# Patient Record
Sex: Female | Born: 1975 | Race: White | Hispanic: Yes | Marital: Married | Smoking: Never smoker
Health system: Southern US, Community
[De-identification: ages and names within clinical notes are randomized; demographics above are authoritative.]

## PROBLEM LIST (undated history)

## (undated) DIAGNOSIS — A749 Chlamydial infection, unspecified: Secondary | ICD-10-CM

## (undated) DIAGNOSIS — E669 Obesity, unspecified: Secondary | ICD-10-CM

## (undated) DIAGNOSIS — E039 Hypothyroidism, unspecified: Secondary | ICD-10-CM

## (undated) DIAGNOSIS — K760 Fatty (change of) liver, not elsewhere classified: Secondary | ICD-10-CM

## (undated) DIAGNOSIS — A599 Trichomoniasis, unspecified: Secondary | ICD-10-CM

## (undated) HISTORY — DX: Hypothyroidism, unspecified: E03.9

## (undated) HISTORY — DX: Obesity, unspecified: E66.9

## (undated) HISTORY — DX: Trichomoniasis, unspecified: A59.9

## (undated) HISTORY — DX: Fatty (change of) liver, not elsewhere classified: K76.0

## (undated) HISTORY — PX: INTRAUTERINE DEVICE (IUD) INSERTION: SHX5877

## (undated) HISTORY — DX: Chlamydial infection, unspecified: A74.9

---

## 1997-05-04 ENCOUNTER — Other Ambulatory Visit: Admission: RE | Admit: 1997-05-04 | Discharge: 1997-05-04 | Payer: Self-pay | Admitting: Gynecology

## 1998-05-05 ENCOUNTER — Other Ambulatory Visit: Admission: RE | Admit: 1998-05-05 | Discharge: 1998-05-05 | Payer: Self-pay | Admitting: Gynecology

## 1998-09-01 ENCOUNTER — Encounter: Payer: Self-pay | Admitting: Gynecology

## 1998-09-01 ENCOUNTER — Ambulatory Visit (HOSPITAL_COMMUNITY): Admission: RE | Admit: 1998-09-01 | Discharge: 1998-09-01 | Payer: Self-pay | Admitting: Gynecology

## 1999-02-14 ENCOUNTER — Ambulatory Visit (HOSPITAL_COMMUNITY): Admission: RE | Admit: 1999-02-14 | Discharge: 1999-02-14 | Payer: Self-pay | Admitting: *Deleted

## 1999-03-28 ENCOUNTER — Ambulatory Visit (HOSPITAL_COMMUNITY): Admission: RE | Admit: 1999-03-28 | Discharge: 1999-03-28 | Payer: Self-pay | Admitting: *Deleted

## 1999-05-10 ENCOUNTER — Inpatient Hospital Stay (HOSPITAL_COMMUNITY): Admission: AD | Admit: 1999-05-10 | Discharge: 1999-05-10 | Payer: Self-pay | Admitting: *Deleted

## 1999-05-16 ENCOUNTER — Encounter (INDEPENDENT_AMBULATORY_CARE_PROVIDER_SITE_OTHER): Payer: Self-pay | Admitting: Specialist

## 1999-05-16 ENCOUNTER — Inpatient Hospital Stay (HOSPITAL_COMMUNITY): Admission: AD | Admit: 1999-05-16 | Discharge: 1999-05-20 | Payer: Self-pay | Admitting: Obstetrics & Gynecology

## 2004-11-07 ENCOUNTER — Other Ambulatory Visit: Admission: RE | Admit: 2004-11-07 | Discharge: 2004-11-07 | Payer: Self-pay | Admitting: Family Medicine

## 2008-11-03 ENCOUNTER — Ambulatory Visit: Payer: Self-pay | Admitting: Gynecology

## 2008-11-03 ENCOUNTER — Encounter: Payer: Self-pay | Admitting: Gynecology

## 2008-11-03 ENCOUNTER — Other Ambulatory Visit: Admission: RE | Admit: 2008-11-03 | Discharge: 2008-11-03 | Payer: Self-pay | Admitting: Gynecology

## 2009-05-05 ENCOUNTER — Ambulatory Visit: Payer: Self-pay | Admitting: Gynecology

## 2009-05-05 DIAGNOSIS — A749 Chlamydial infection, unspecified: Secondary | ICD-10-CM

## 2009-05-05 HISTORY — DX: Chlamydial infection, unspecified: A74.9

## 2009-06-03 ENCOUNTER — Ambulatory Visit: Payer: Self-pay | Admitting: Gynecology

## 2009-06-17 ENCOUNTER — Ambulatory Visit: Payer: Self-pay | Admitting: Gynecology

## 2009-12-24 ENCOUNTER — Inpatient Hospital Stay (HOSPITAL_COMMUNITY)
Admission: AD | Admit: 2009-12-24 | Discharge: 2009-12-24 | Payer: Self-pay | Source: Home / Self Care | Attending: Obstetrics & Gynecology | Admitting: Obstetrics & Gynecology

## 2009-12-30 ENCOUNTER — Inpatient Hospital Stay (HOSPITAL_COMMUNITY)
Admission: AD | Admit: 2009-12-30 | Discharge: 2010-01-02 | Payer: Self-pay | Source: Home / Self Care | Attending: Obstetrics and Gynecology | Admitting: Obstetrics and Gynecology

## 2010-03-28 LAB — CBC
HCT: 28.4 % — ABNORMAL LOW (ref 36.0–46.0)
HCT: 35.9 % — ABNORMAL LOW (ref 36.0–46.0)
Hemoglobin: 12.7 g/dL (ref 12.0–15.0)
Hemoglobin: 9.7 g/dL — ABNORMAL LOW (ref 12.0–15.0)
MCH: 30.6 pg (ref 26.0–34.0)
MCH: 31.2 pg (ref 26.0–34.0)
MCHC: 34.2 g/dL (ref 30.0–36.0)
MCHC: 35.4 g/dL (ref 30.0–36.0)
MCV: 88.2 fL (ref 78.0–100.0)
MCV: 89.6 fL (ref 78.0–100.0)
Platelets: 145 10*3/uL — ABNORMAL LOW (ref 150–400)
Platelets: 169 10*3/uL (ref 150–400)
RBC: 3.17 MIL/uL — ABNORMAL LOW (ref 3.87–5.11)
RBC: 4.07 MIL/uL (ref 3.87–5.11)
RDW: 13.2 % (ref 11.5–15.5)
RDW: 13.4 % (ref 11.5–15.5)
WBC: 6.7 10*3/uL (ref 4.0–10.5)
WBC: 8.8 10*3/uL (ref 4.0–10.5)

## 2010-03-28 LAB — RPR: RPR Ser Ql: NONREACTIVE

## 2010-06-03 NOTE — Discharge Summary (Signed)
[facility]  Patient:    Sheryl Logan, Sheryl Logan                       MRN: [id number] Adm. Date:  [id number] Disc. Date: [id number] Attending:  Antionette Char Dictator:   Marvis Moeller, C.N.M.                           Discharge Summary  HISTORY OF PRESENT ILLNESS:   This is a 35 year old Hispanic female, gravida 4, para 2-0-1-2, presenting at 40 weeks who had bloody show, contractions, and possible rupture of membranes.  OBSTETRICAL HISTORY:          Two term NSVDs, AGA babies, and SAB at eight weeks with D&C.  SOCIAL HISTORY:               Negative for smoking, alcohol, or substance abuse.  GYNECOLOGICAL HISTORY:        Noncontributory.  PAST MEDICAL HISTORY:         Noncontributory.  PAST SURGICAL HISTORY:        No surgeries.  MEDICATIONS:                  Prenatal vitamins.  ALLERGIES:                    None.  PRENATAL LABORATORY DATA:     Rubella nonimmune.  GBS positive February 21, 1999.  PHYSICAL EXAMINATION:  VITAL SIGNS:                  Temperature 99.1, BP 147/86, pulse 50.  Fetal heart rate was in the 130s and reactive without decelerations.  Contractions were occurring every three to four minutes.  CHEST:                        Clear bilaterally.  CARDIOVASCULAR:               Regular rate and rhythm.  ABDOMEN:                      Term-size, gravid.  EXTREMITIES:                  DTRs were 2+ without clonus.  She had 2+ pedal edema.  CERVICAL:                     On admission she was 3-4, 70%, -2, cephalic.  On speculum exam, there was no pooling and negative ferning.  LABORATORY DATA:              Preeclampsia laboratories were significant for a uric acid of 8.8 and a creatinine of 0.8.  Of note, her ultrasound done at 34 weeks showed good interval growth and normal amniotic fluid volume.  HOSPITAL COURSE:              The patient was admitted, and Pitocin augmentation was successful.  She progressed in labor well  and received magnesium sulfate in labor for seizure prophylaxis.  After approximately 1-1/2 hours second stage, she had a spontaneous vaginal delivery of a viable female whose Apgars were 1 at one minute, 3 at five minutes, and 6 at 10 minutes. The infant was initially bagged by R.N., and NICU team was present at approximately three minutes after the birth.  The cord pH was 7.33.  Third stage was  uneventful.  The second-degree midline perineal laceration was repaired with 3-0 Vicryl, and the mother received postpartum Pitocin IV.  She continued on magnesium sulfate for two days, as her diuresis was relatively slow to occur.  She did get severely anemia, with a hemoglobin in the range of 6.3, down from her baseline on admission which was 12.2.  It was not noted in her postpartum progress notes that she had any excessive bleeding or any atony, and the effect was felt to be dilutional to some extent.  On postpartum day #3, magnesium was discontinued, and the patient remained asymptomatic for preeclampsia as well as asymptomatic for anemia.  She did not have tachycardia, shortness of breath, or orthostatic symptoms.  On the day of discharge, blood pressure is 120/70, pulse 80, and temperature is 98.  After receiving rubella vaccination, she was sent home on iron supplementation and prenatal vitamins as well as ibuprofen and Micronor.  She had routine postpartum instructions and was to be seen at six weeks at [facility]. DD:  08/15/99 TD:  08/16/99 Job: [id number]

## 2012-10-15 ENCOUNTER — Encounter: Payer: Self-pay | Admitting: Gynecology

## 2012-10-15 ENCOUNTER — Ambulatory Visit (INDEPENDENT_AMBULATORY_CARE_PROVIDER_SITE_OTHER): Payer: Commercial Managed Care - PPO | Admitting: Gynecology

## 2012-10-15 ENCOUNTER — Other Ambulatory Visit (HOSPITAL_COMMUNITY)
Admission: RE | Admit: 2012-10-15 | Discharge: 2012-10-15 | Disposition: A | Discharge status: Home / Self Care | Payer: Commercial Managed Care - PPO | Source: Ambulatory Visit | Attending: Gynecology | Admitting: Gynecology

## 2012-10-15 VITALS — BP 122/80 | Ht 60.5 in | Wt 172.0 lb

## 2012-10-15 DIAGNOSIS — R2231 Localized swelling, mass and lump, right upper limb: Secondary | ICD-10-CM

## 2012-10-15 DIAGNOSIS — Z1151 Encounter for screening for human papillomavirus (HPV): Secondary | ICD-10-CM | POA: Insufficient documentation

## 2012-10-15 DIAGNOSIS — Z01419 Encounter for gynecological examination (general) (routine) without abnormal findings: Secondary | ICD-10-CM

## 2012-10-15 DIAGNOSIS — R635 Abnormal weight gain: Secondary | ICD-10-CM

## 2012-10-15 DIAGNOSIS — R229 Localized swelling, mass and lump, unspecified: Secondary | ICD-10-CM

## 2012-10-15 DIAGNOSIS — R2230 Localized swelling, mass and lump, unspecified upper limb: Secondary | ICD-10-CM | POA: Insufficient documentation

## 2012-10-15 DIAGNOSIS — Z23 Encounter for immunization: Secondary | ICD-10-CM

## 2012-10-15 LAB — COMPREHENSIVE METABOLIC PANEL
Albumin: 3.8 g/dL (ref 3.5–5.2)
BUN: 8 mg/dL (ref 6–23)
Calcium: 8.6 mg/dL (ref 8.4–10.5)
Chloride: 106 mEq/L (ref 96–112)
Glucose, Bld: 89 mg/dL (ref 70–99)
Potassium: 3.9 mEq/L (ref 3.5–5.3)

## 2012-10-15 LAB — LIPID PANEL
HDL: 36 mg/dL — ABNORMAL LOW (ref >39)
Triglycerides: 147 mg/dL (ref <150)

## 2012-10-15 NOTE — Patient Instructions (Addendum)
Vacuna difteria, tétanos, tos ferina (DTP) - Lo que debe saber   (Tetanus, Diphtheria, Pertussis [Tdap] Vaccine, What You Need to Know)  ¿PORQUÉ VACUNARSE?   El tétanos, la difteria y la tos ferina pueden ser enfermedades muy graves, aún en adolescentes y adultos. La vacuna Tdap nos puede proteger de estas enfermedades.   El TÉTANOS (Trismo) provoca la contracción dolorosa de los músculos, por lo general, en todo el cuerpo.   · Puede causar el endurecimiento de los músculos de la cabeza y el cuello, de modo que impide abrir la boca, tragar y en algunos casos, respirar. El tétanos causa la muerte de 1 de cada 5 personas que se infectan.  La DIFTERIA produce la formación de una membrana gruesa que cubre el fondo de la garganta.   · Puede causar problemas respiratorios, parálisis, insuficiencia cardíaca e incluso la muerte.  TOS FERINA (Pertusis) causa episodios de tos graves, que pueden hacer difícil la respiración, causar vómitos y trastornos del sueño.   · También puede ser la causa de pérdida de peso, incontinencia y fractura de costillas. Dos de cada 100 adolescentes y cinco de cada 100 adultos que enferman de pertusis deben ser hospitalizados, tienen complicaciones como la neumonía o mueren.  Estas enfermedades son provocadas por bacterias. La difteria y el pertusis se contagian de persona a persona a través de la tos o el estornudo. El tétanos ingresa al organismo a través de cortes, rasguños o heridas.   Antes de las vacunas, en los [place] se vieron más de 200.000 casos al año de difteria y tos ferina y cientos de casos de tétanos. Desde el inicio de la vacunación, los casos de tétanos y difteria han disminuido alrededor del 99% y los casos de tos ferina alrededor del 80%.   Tdap   La vacuna Tdap protege a adolescentes y adultos contra el tétanos, la difteria y la tos ferina. Una dosis de Tdap se administra a los 11 o 12 años de edad. Las personas que no recibieron la vacuna Tdap a esa edad deben  recibirla tan pronto como sea posible.   Es muy importante que los profesionales de la salud y todos aquellos que tengan contacto cercano con bebés menores de 12 meses reciban la Tdap.   Las mujeres embarazadas deben recibir una dosis de Tdap en cada embarazo, para proteger al recién nacido de la tos ferina. Los niños tienen mayor riesgo de complicaciones graves y potencialmente mortales debido a la tos ferina.   Una vacuna similar, llamada Td, protege contra el tétanos y la difteria, pero no contra la tos ferina. Cada 10 años debe recibirse un refuerzo de Td. La Tdap se puede administrar como uno de estos refuerzos, si todavía no ha recibido una dosis. También se puede aplicar después de un corte o quemadura grave para prevenir la infección por tétanos.   El médico le dará más información.   La Tdap puede administrarse de manera segura simultáneamente con otras vacunas.   ALGUNAS PERSONAS NO DEBEN RECIBIR ESTA VACUNA.   · Si alguna vez tuvo una reacción alérgica potencialmente mortal después de una dosis de la vacuna contra el tétanos, la diferia o la tos ferina, o tuvo una alergia grave a cualquiera de los componentes de esta vacuna, no debe aplicarse la vacuna. Informe a su médico si usted sufre algún tipo de alergia grave.  · Si estuvo en coma o sufrió múltiples convulsiones dentro de los 7 días posteriores después de una dosis de DTP o DTaP   su mdico si:  tiene epilepsia u otra enfermedad del sistema nervioso,  siente dolor intenso o se hincha despus de recibir cualquier vacuna contra la difteria, el ttanos o la tos ferina,  alguna vez ha sufrido el sndrome de Guillain-Barr,  no se siente bien el da en que se ha programado la vacuna. RIESGOS DE UNA REACCIN A LA VACUNA Con cualquier medicamento, incluyendo las vacunas, existe la posibilidad de que aparezcan efectos secundarios. Estos son  leves y desaparecen por s solos, pero tambin son posibles las reacciones graves.  Breves episodios de desmayo pueden seguir a una vacunacin, causando lesiones por la cada. Sentarse o recostarse durante 15 minutos puede ayudar a evitarlo. Informe al mdico si se siente mareado o aturdido, tiene cambios en la visin o zumbidos en los odos.  Problemas leves luego de la Tdap (no interferirn con las actividades)   Dolor en el sitio de la inyeccin (alrededor de 1 de cada 4 adolescentes o 2 de cada 3 adultos).  Enrojecimiento o hinchazn en el lugar de la inyeccin (1 de cada 5 personas).  Fiebre leve de al menos 100,4 F (38 C) (hasta alrededor de 1 cada 25 adolescentes y 1 de cada 100 adultos).  Dolor de cabeza (3 o 4de cada 10 personas).  Cansancio (1 de cada 3 o 4 personas).  Nuseas, vmitos, diarrea, dolor de estmago (1 de cada 4 adolescentes o 1 de cada 10 adultos).  Escalofros, dolores corporales, dolor articular, erupciones, inflamacin de las glndulas (poco frecuente). Problemas moderados: (interfieren con las actividades, pero no requieren atencin mdica)   Dolor en el lugar de la inyeccin (1 de cada 5 adolescentes o 1 de cada 100 adultos).  Enrojecimiento o inflamacin (1 de cada 16 adolescentes y 1 de cada 25 adultos).  Fiebre de ms de 102F o 38,9C (1 de cada 100 adolescentes o 1 de cada 250 adultos).  Dolor de cabeza (alrededor de 4 de cada 20 adolescentes y 3 de cada 10 adultos).  Nuseas, vmitos, diarrea, dolor de estmago (1 a 3 de cada 100 personas).  Hinchazn de todo el brazo en el que se aplic la vacuna (3 de cada100 personas). Problemas graves: luego de la Tdap (no puede realizar las actividades habituales, requiere atencin mdica)   Inflamacin, dolor intenso, sangrado y enrojecimiento en el brazo, en el sitio de la inyeccin (poco frecuente). Una reaccin alrgica grave puede ocurrir despus de la administracin de cualquier vacuna (se estima en  menos de 1 en un milln de dosis).  QU PASA SI HAY UNA REACCIN GRAVE?  Qu signos debo buscar?  Observe todo lo que le preocupe, como signos de una reaccin alrgica grave, fiebre muy alta o cambios en el comportamiento. Los signos de una reaccin alrgica grave pueden incluir urticaria, hinchazn de la cara y la garganta, dificultad para respirar, ritmo cardaco acelerado, mareos y debilidad. Estos sntomas pueden comenzar entre unos pocos minutos y algunas horas despus de la vacunacin.  Qu debo hacer?  Si usted piensa que se trata de una reaccin alrgica grave o de otra emergencia que no puede esperar, llame al 911 o lleve a la persona al hospital ms cercano. De lo contrario, llame a su mdico.  Despus, la reaccin debe informarse a la "Vaccine Adverse Event Reporting System" (Sistema de informacin sobre efectos adversos de las vacunas -VAERS). El mdico o usted mismo pueden realizar el informe en el sitio web del VAERS www.vaers.hhs.govo llame al 1-800-822-7967. El VAERS es slo para informar reacciones. No   brindan consejo mdico.  PROGRAMA NACIONAL DE COMPENSACIN DE DAOS POR VACUNAS  El National Vaccine Injury [employer] (VICP) es un programa federal que fue creado para compensar a las personas que puedan haber sufrido daos al recibir ciertas vacunas.  Aquellas personas que consideren que han sufrido un dao como consecuencia de una vacuna y quieren saber ms acerca del programa y como presentar Roslynn Amble, pueden llamar [phone] o visite el sitio web del VICP en SpiritualWord.[email or web address].  CMO PUEDO OBTENER MS INFORMACIN?   Consulte a su mdico.  Comunquese con el servicio de salud de su localidad o [address].  Comunquese con los Centros para el control y la prevencin de Child psychotherapist for Disease Control and Prevention , CDC).  llamando al [phone] o visitando el sitio web del CDC en PicCapture.uy. CDC Tdap Vaccine VIS  (05/25/11)  Document Released: 12/20/2011 [facility] Patient Information 2014 [place], [place].  Ejercicios para perder peso (Exercise to Lose Weight) La actividad fsica y Neomia Dear dieta saludable ayudan a perder peso. El mdico podr sugerirle ejercicios especficos. IDEAS Y CONSEJOS PARA HACER EJERCICIOS  Elija opciones econmicas que disfrute hacer , como caminar, andar en bicicleta o los vdeos para ejercitarse.   Utilice las Microbiologist del ascensor.   Camine durante la hora del almuerzo.   Estacione el auto lejos del lugar de [place] o [place].   Concurra a un gimnasio o tome clases de gimnasia.   Comience con 5  10 minutos de actividad fsica por da. Ejercite hasta 30 minutos, 4 a 6 das por [address]   Utilice zapatos que tengan un buen soporte y ropas cmodas.   Elongue antes y despus de Company secretary.   Ejercite hasta que aumente la respiracin y el corazn palpite rpido.   Beba agua extra cuando ejercite.   No haga ejercicio Firefighter, sentirse mareado o que le falte mucho el aire.  La actividad fsica puede quemar alrededor de 150 caloras.  Correr 20 cuadras en 15 minutos.   Jugar vley durante 45 a 60 minutos.   Limpiar y encerar el auto durante 45 a 60 minutos.   Jugar ftbol americano de toque.   Caminar 25 cuadras en 35 minutos.   Empujar un cochecito 20 cuadras en 30 minutos.   Jugar baloncesto durante 30 minutos.   Rastrillar hojas secas durante 30 minutos.   Andar en bicicleta 80 cuadras en 30 minutos.   Caminar 30 cuadras en 30 minutos.   Bailar durante 30 minutos.   Quitar la nieve con una pala durante 15 minutos.   Nadar vigorosamente durante 20 minutos.   Subir escaleras durante 15 minutos.   Andar en bicicleta 60 cuadras durante 15 minutos.   Arreglar el jardn entre 30 y 45 minutos.   Saltar a la soga durante 15 minutos.   Limpiar vidrios o pisos durante 45 a 60 minutos.  Document Released: 04/08/2010 Document Revised:  09/14/2010 [facility] Patient Information 2012 [place], [place].                                                  Control del colesterol  Los niveles de colesterol en el organismo estn determinados significativamente por su dieta. Los niveles de colesterol tambin se relacionan con la enfermedad cardaca. El material que sigue ayuda a Software engineer relacin y a Chiropractor qu puede hacer para Curator  corazn sano. No todo el colesterol es [place]. Las lipoprotenas de baja densidad (LDL) forman el colesterol "malo". El colesterol malo puede ocasionar depsitos de grasa que se acumulan en el interior de las arterias. Las lipoprotenas de alta densidad (HDL) es el colesterol "bueno". Ayuda a remover el colesterol LDL "malo" de la [place]. El colesterol es un factor de riesgo muy importante para la enfermedad cardaca. Otros factores de riesgo son la hipertensin arterial, el hbito de fumar, el estrs, la herencia y [place].   El msculo cardaco obtiene el suministro de sangre a travs de las arterias coronarias. Si su colesterol LDL ("malo") est elevado y el HDL ("bueno") es bajo, tiene un factor de riesgo para que se formen depsitos de Holiday representative en las arterias coronarias (los vasos sanguneos que suministran sangre al corazn). Esto hace que haya menos lugar para que la sangre circule. Sin la suficiente sangre y oxgeno, el msculo cardaco no puede funcionar correctamente, y usted podr sentir dolores en el pecho (angina pectoris). Cuando una arteria coronaria se cierra completamente, una parte del msculo cardaco puede morir (infarto de miocardio).  CONTROL DEL COLESTEROL Cuando el profesional que lo asiste enva la sangre al laboratorio para Artist nivel de colesterol, puede realizarle tambin un perfil completo de los lpidos. Con esta prueba, se puede determinar la cantidad total de colesterol, as como los niveles de LDL y HDL. Los triglicridos son un tipo de grasa que circula en la sangre y que  tambin puede utilizarse para determinar el riesgo de enfermedad cardaca. En la siguiente tabla se establecen los nmeros ideales: Prueba: Colesterol total  Menos de 200 mg/dl.  Prueba: LDL "colesterol malo"  Menos de 100 mg/dl.   Menos de 70 mg/dl si tiene riesgo muy elevado de sufrir un ataque cardaco o muerte cardaca sbita.  Prueba: HDL "colesterol bueno"  Mujeres: Ms de 50 mg/dl.   Hombres: Ms de 40 mg/dl.  Prueba: Trigliceridos  Menos de 150 mg/dl.    CONTROL DEL COLESTEROL CON DIETA Aunque factores como el ejercicio y el estilo de vida son importantes, la "primera lnea de ataque" es la dieta. Esto se debe a que se sabe que ciertos alimentos hacen subir el colesterol y otros lo [place]. El objetivo debe ser [employer] Foods alimentos, de modo que tengan un efecto sobre el colesterol y, an ms importante, Microbiologist las grasas saturadas y trans con otros tipos de grasas, como las monoinsaturadas y las poliinsaturadas y cidos grasos omega-3 . En promedio, una persona no debe consumir ms de 15 a 17 g de grasas saturadas por [employer]. Las grasas saturadas y trans se consideran grasas "malas", ya que elevan el colesterol LDL. Las grasas saturadas se encuentran principalmente en productos animales como carne, [place] y crema. Pero esto no significa que usted Marketing executive todas sus comidas favoritas. Actualmente, como lo muestra el cuadro que figura al final de este documento, hay sustitutos de buen sabor, bajos en grasas y en colesterol, para la mayora de los alimentos que a usted Musician. Elija aquellos alimentos alternativos que sean bajos en grasas o sin grasas. Elija cortes de carne del cuarto trasero o lomo ya que estos cortes son los que tienen menor cantidad de grasa y Oncologist. El pollo (sin piel), el pescado, la carne de ternera, y la [place] de [place] molida son excelentes opciones. Elimine las carnes [employer] Foods o el salami. Los [employer] o nada de  grasas saturadas. Cuando consuma carne [place], carne  de aves de corral, o pescado, hgalo en porciones de 85 gramos (3 onzas). Las grasas trans tambin se llaman "aceites parcialmente hidrogenados". Son aceites manipulados cientficamente de [place] que son slidos a Publishing rights manager, tienen una larga vida y Glass blower/designer sabor y la textura de los alimentos a los que se Scientist, clinical (histocompatibility and immunogenetics). Las grasas trans se encuentran en la [place], [place], crackers y alimentos horneados.  Para hornear y cocinar, el aceite es un excelente sustituto para la [place]. Los aceites monoinsaturados tienen un beneficio particular, ya que se cree que disminuyen el colesterol LDL (colesterol malo) y elevan el HDL. Deber evitar los aceites tropicales saturados como el de coco y el de [place].  Recuerde, adems, que puede comer sin restricciones los grupos de alimentos que son naturalmente libres de grasas saturadas y Neurosurgeon trans, entre los que se incluyen el pescado, las frutas (excepto el [place]), verduras, frijoles, cereales (cebada, arroz, [place], trigo) y las pastas (sin salsas con crema)   IDENTIFIQUE LOS ALIMENTOS QUE DISMINUYEN EL COLESTEROL  Pueden disminuir el colesterol las fibras solubles que estn en las frutas, como las [place], en los vegetales como el brcoli, las patatas y las zanahorias; en las legumbres como frijoles, guisantes y Therapist, occupational; y en los cereales como la cebada. Los alimentos fortificados con fitosteroles tambin Engineer, production. Debe consumir al menos 2 g de estos alimentos a diario para Financial planner de disminucin de [place].  En el supermercado, lea las etiquetas de los envases para identificar los alimentos bajos en grasas saturadas, libres de grasas trans y bajos en [place], . Elija quesos que tengan solo de 2 a 3 g de grasa saturada por onza (28,35 g). Use una margarina que no dae el corazn, [place] de grasas trans o aceite parcialmente hidrogenado. Al comprar alimentos horneados  (galletitas dulces y Gaffer) evite el aceite parcialmente hidrogenado. Los panes y bollos debern ser de granos enteros (harina de maz o de avena entera, en lugar de "harina" o "harina enriquecida"). Compre sopas en lata que no sean cremosas, con bajo contenido de sal y sin grasas adicionadas.   TCNICAS DE PREPARACIN DE LOS ALIMENTOS  Nunca fra los alimentos en aceite abundante. Si debe frer, hgalo en poco aceite y removiendo [place], porque as se utilizan muy pocas grasas, o utilice un spray antiadherente. Cuando le sea posible, hierva, hornee o ase las carnes y cocine los vegetales al vapor. En vez de Aetna con mantequilla o [place], utilice limn y hierbas, pur de Psychologist, educational y canela (para las calabazas y batatas), yogurt y salsa descremados y aderezos para ensaladas bajos en contenido graso.   BAJO EN GRASAS SATURADAS / SUSTITUTOS BAJOS EN GRASA  Carnes / Grasas saturadas (g)  Evite: Bife, corte graso (3 oz/85 g) / 11 g   Elija: Bife, corte magro (3 oz/85 g) / 4 g   Evite: [place] (3 oz/85 g) / 7 g   Elija:  [place] magra (3 oz/85 g) / 5 g   Evite: Jamn (3 oz/85 g) / 6 g   Elija:  Jamn magro (3 oz/85 g) / 2.4 g   Evite: Pollo, con piel (3 oz/85 g), Carne oscura / 4 g   Elija:  Pollo, sin piel (3 oz/85 g), Carne oscura / 2 g   Evite: Pollo, con piel (3 oz/85 g), Carne magra / 2.5 g   Elija: Pollo, sin piel (3 oz/85 g), Carne magra / 1 g  Lcteos / Grasas saturadas (g)  Evite: Moses Manners (  1 taza) / 5 g   Elija: Leche con bajo contenido de grasa, 2% (1 taza) / 3 g   Elija: Leche con bajo contenido de grasa, 1% (1 taza) / 1.5 g   Elija: Leche descremada (1 taza) / 0.3 g   Evite: Queso duro (1 oz/28 g) / 6 g   Elija: Queso descremado (1 oz/28 g) / 2-3 g   Evite: Queso cottage, 4% grasa (1 taza)/ 6.5 g   Elija: Queso cottage con bajo contenido de grasa, 1% grasa (1 taza)/ 1.5 g   Evite: Helado (1 taza) / 9 g   Elija: Sorbete  (1 taza) / 2.5 g   Elija: Yogurt helado sin contenido de grasa (1 taza) / 0.3 g   Elija: Barras de fruta congeladas / vestigios   Evite: Crema batida (1 cucharada) / 3.5 g   Elija: Batidos glac sin lcteos (1 cucharada) / 1 g  Condimentos / Grasas saturadas (g)  Evite: Mayonesa (1 cucharada) / 2 g   Elija: Mayonesa con bajo contenido de grasa (1 cucharada) / 1 g   Evite: [place] (1 cucharada) / 7 g   Elija: Margarina extra light (1 cucharada) / 1 g   Evite: Aceite de coco (1 cucharada) / 11.8 g   Elija: Aceite de oliva (1 cucharada) / 1.8 g   Elija: Aceite de maz (1 cucharada) / 1.7 g   Elija: Aceite de crtamo (1 cucharada) / 1.2 g   Elija: Aceite de girasol (1 cucharada) / 1.4 g   Elija: Aceite de soja (1 cucharada) / 2.4 g   Elija: Aceite de canola (1 cucharada) / 1 g  Document Released: 01/02/2005 Document Revised: 09/14/2010 [facility] Patient Information 2012 [place], [place].

## 2012-10-15 NOTE — Progress Notes (Signed)
Sheryl Logan 1975-07-03 [id number]   History:    37 y.o.  for annual gyn exam is C. The office for 2 years. She stated that for the past 2 years she's noticed this mass on the medial aspect of her right arm has gotten bigger and is tender to touch at times she feels some numbness in her fingers. She denies any recent injury. She is a gravida 6 para 4 AB 2. She had a ParaGard T380A IUD placed 2 years ago. Her menstrual cycles are regular. She doesn't monthly breast exam. Review of her record indicated that several years ago she had been treated for trichomoniasis and chlamydia. Patient was concerned about her weight gain. Patient denies any prior history of abnormal Pap smears. Patient had an uncomplicated pregnancy is at term. All pregnancies delivered vaginally.  Past medical history,surgical history, family history and social history were all reviewed and documented in the EPIC chart.  Gynecologic History Patient's last menstrual period was 10/15/2012. Contraception: IUD Last Pap: over 3 years ago. Results were: normal Last mammogram: none indicated. Results were: that indicated  Obstetric History OB History  Gravida Para Term Preterm AB SAB TAB Ectopic Multiple Living  6 4   1 1    4     # Outcome Date GA Lbr Len/2nd Weight Sex Delivery Anes PTL Lv  6 SAB           5 PAR           4 PAR           3 PAR           2 PAR           1 GRA                ROS: A ROS was performed and pertinent positives and negatives are included in the history.  GENERAL: No fevers or chills. HEENT: No change in vision, no earache, sore throat or sinus congestion. NECK: No pain or stiffness. CARDIOVASCULAR: No chest pain or pressure. No palpitations. PULMONARY: No shortness of breath, cough or wheeze. GASTROINTESTINAL: No abdominal pain, nausea, vomiting or diarrhea, melena or bright red blood per rectum. GENITOURINARY: No urinary frequency, urgency, hesitancy or dysuria. MUSCULOSKELETAL: No joint or muscle  pain, no back pain, no recent trauma.right arm mass DERMATOLOGIC: No rash, no itching, no lesions. ENDOCRINE: No polyuria, polydipsia, no heat or cold intolerance. No recent change in weight. HEMATOLOGICAL: No anemia or easy bruising or bleeding. NEUROLOGIC: No headache, seizures, numbness, tingling or weakness. PSYCHIATRIC: No depression, no loss of interest in normal activity or change in sleep pattern.     Exam: chaperone present  BP 122/80  Ht 5' 0.5" (1.537 m)  Wt 172 lb (78.019 kg)  BMI 33.03 kg/m2  LMP 10/15/2012  Body mass index is 33.03 kg/(m^2).  General appearance : Well developed well nourished female. No acute distress HEENT: Neck supple, trachea midline, no carotid bruits, no thyroidmegaly Lungs: Clear to auscultation, no rhonchi or wheezes, or rib retractions  Heart: Regular rate and rhythm, no murmurs or gallops Breast:Examined in sitting and supine position were symmetrical in appearance, no palpable masses or tenderness,  no skin retraction, no nipple inversion, no nipple discharge, no skin discoloration, no axillary or supraclavicular lymphadenopathy Abdomen: no palpable masses or tenderness, no rebound or guarding Extremities: no edema or skin discoloration or tenderness  Physical Exam  Musculoskeletal:       Arms:    Pelvic:  Bartholin, Urethra,  Skene Glands: Within normal limits             Vagina: No gross lesions or discharge  Cervix: No gross lesions or dischargeIUD string seen  Uterus  anteverted, normal size, shape and consistency, non-tender and mobile  Adnexa  Without masses or tenderness  Anus and perineum  normal   Rectovaginal  normal sphincter tone without palpated masses or tenderness             Hemoccult that indicated     Assessment/Plan:  37 y.o. female for annual exam will be sent to radiology for soft tissue ultrasound of the right medial aspect of her near the insertion of her bicep tendon. The following labs were ordered  today:comprehensive metabolic panel, hemoglobin A1c, fasting lipid profile, TSH, and urinalysis. Pap smear was done today. Patient did receive a Tdap vaccine today. We'll wait for results and the radiologist to make sure that this is just an epidermal inclusion cyst and an MRI is not needed. Patient was reminded her monthly breast exams. Literature formations management exercise and diet provided.  Ok Edwards MD, 3:13 PM 10/15/2012

## 2012-10-16 ENCOUNTER — Telehealth: Payer: Self-pay | Admitting: *Deleted

## 2012-10-16 DIAGNOSIS — R2231 Localized swelling, mass and lump, right upper limb: Secondary | ICD-10-CM

## 2012-10-16 LAB — URINALYSIS W MICROSCOPIC + REFLEX CULTURE
Bacteria, UA: NONE SEEN
Casts: NONE SEEN
Crystals: NONE SEEN
Ketones, ur: NEGATIVE mg/dL
Nitrite: NEGATIVE
Specific Gravity, Urine: 1.011 (ref 1.005–1.030)
pH: 6 (ref 5.0–8.0)

## 2012-10-16 LAB — TSH: TSH: 3.091 u[IU]/mL (ref 0.350–4.500)

## 2012-10-16 NOTE — Telephone Encounter (Signed)
Message copied by Aura Camps on Wed Oct 16, 2012  9:27 AM ------      Message from: Ok Edwards      Created: Tue Oct 15, 2012  3:23 PM       Please schedule ultrasound for this patient on her right arm mass located medial aspect of her bicep insertion. ------

## 2012-10-16 NOTE — Telephone Encounter (Signed)
Appointment on 10/21/12 @10 :45 am at [facility]. Left message for pt to call.

## 2012-10-16 NOTE — Telephone Encounter (Signed)
Pt informed with the below note. 

## 2012-10-17 LAB — URINE CULTURE

## 2012-10-21 ENCOUNTER — Ambulatory Visit (HOSPITAL_COMMUNITY): Payer: Commercial Managed Care - PPO

## 2013-09-24 ENCOUNTER — Encounter: Payer: Self-pay | Admitting: Gynecology

## 2013-09-24 ENCOUNTER — Ambulatory Visit (INDEPENDENT_AMBULATORY_CARE_PROVIDER_SITE_OTHER): Payer: Commercial Managed Care - PPO | Admitting: Gynecology

## 2013-09-24 VITALS — BP 118/78

## 2013-09-24 DIAGNOSIS — R229 Localized swelling, mass and lump, unspecified: Secondary | ICD-10-CM

## 2013-09-24 DIAGNOSIS — Z3009 Encounter for other general counseling and advice on contraception: Secondary | ICD-10-CM

## 2013-09-24 DIAGNOSIS — Z308 Encounter for other contraceptive management: Secondary | ICD-10-CM

## 2013-09-24 DIAGNOSIS — R2231 Localized swelling, mass and lump, right upper limb: Secondary | ICD-10-CM

## 2013-09-24 NOTE — Patient Instructions (Signed)
Reversin de la ligadura de trompas La ligadura de trompas es un procedimiento que cierra las trompas de [place] para Location manager. La reversin Investment banker, operational a abrir, Engineer, building services las trompas de [place] y lo que revierte el procedimiento realizado inicialmente. El porcentaje de xito de quedar embarazada despus de este procedimiento depende de:   La edad  El tipo de ligadura de trompas que tena originalmente.  La longitud de las trompas de [place] que quedan y si los tubos todava estn sanos.  La cantidad de tejido cicatrizal en el rea plvica.  La fertilidad de su pareja. INFORME A SU MDICO SOBRE:  Alergias a alimentos o medicamentos.  Medicamentos que [place], incluyendo vitaminas, hierbas, gotas oftlmicas, medicamentos de venta libre y cremas.  Uso de corticoides (por va oral o cremas).  Problemas anteriores debido a anestsicos.  Antecedentes de hemorragias o cogulos sanguneos.  Resfros o infecciones recientes.  Cirugas anteriores.  Otros problemas de salud, incluyendo diabetes y problemas renales.  Posibilidad de embarazo, si corresponde. RIESGOS Y COMPLICACIONES  Rayna Sexton en revertir el procedimiento original.  No quedar embarazada.  Embarazo ectpicoHeron Nay.  Infeccin.  Lesin en los rganos circundantes.  Cogulos sanguneos en las piernas y el trax.  Problemas con la anestesia.  Reaccin alrgica a un medicamento. ANTES DEL PROCEDIMIENTO  Probablemente usted y su pareja necesiten un examen fsico para descartar problemas de fertilidad. Por el mismo motivo tambin le solicitarn anlisis de sangre y pruebas de diagnstico por imgenes.  No tome aspirina ni anticoagulantes durante la semana previa al procedimiento, o segn le hayan indicado. Puede ocasionar hemorragias.  No debe comer ni beber nada durante las 6 a 8 horas previas al procedimiento. PROCEDIMIENTO  Antes del procedimiento le darn un medicamento para que  pueda relajarse (sedante). Durante el procedimiento le administrarn un medicamento que la har dormir (anestesia general).  Le colocarn un tubo por la garganta para ayudarla a respirar [employer] de la anestesia general.  Conley Rolls insertarn un tubo delgado con una fuente de luz (laparoscopio) que lleva unida una cmara a travs del ombligo y en el rea plvica. Tambin se insertan otros instrumentos pequeos a travs de un corte (incisin), cerca del ombligo.  Los clips o anillos que obstruyen las trompas de [place] se retiran con instrumentos quirrgicos.  Si es posible, los tubos se vuelven a Chartered loss adjuster al tero con suturas.  Las incisiones se cierran con puntos de sutura y se coloca un vendaje sobre las incisiones. DESPUS DEL PROCEDIMIENTO  Deber hacer reposo en la sala de recuperacin hasta que se encuentre estable y se sienta bien.  Volver a su casa segn el tipo de [place] a la que fue sometida y Retail banker en que evolucione. Ser necesario que [place] persona lo lleve hasta su casa. Puede llevar una semana o dos completar la curacin.  Tendr algunas molestias leves durante 3 a 7 das. Le darn un medicamento para calmar las molestias.  Es esperable que tenga ciertas molestias en la garganta. Es consecuencia de Education officer, community del tubo mientras se encontraba dormido.  Deber hacerse controles con radiografas de contraste alrededor de 3 a 4 meses luego de la [place], para asegurarse de que los tubos estn abiertos y funcionando. Document Released: 07/04/2011 Document Revised: 05/19/2013 [facility] Patient Information 2015 [employer], [place]. This information is not intended to replace advice given to you by your health care provider. Make sure you discuss any questions you have with your health care provider.

## 2013-09-24 NOTE — Progress Notes (Signed)
   38 year old gravida 4 para 4 presented to the office today to discuss possible option of permanent sterilization such as in a tubal ligation. Patient had a ParaGard T380A IUD placing 2012. She reports normal menstrual cycles lasting approximately 5 days. The reason that she wanted to consider having a tubal ligation is that she felt that she was gaining weight with the IUD. We had a lengthy discussion on the mechanism of action of the ParaGard T380A IUD. I stressed upon her that this form of contraception has no hormones whatsoever. She will need to work on diet and regular exercise to help with her weight. I did provide her with information on the ParaGard IUD in Spanish so she could read further detail about this product. I also did provide her with information on laparoscopic tubal ligation in the event that she would like to proceed with this route.  Review of her records indicated that she had last year a mass on the right medial aspect of her arm and she was referred for an ultrasound in for consultation but she did not follow through. It is still present she feels like it has gotten bigger and she feels numbness and tingling on her hands. I'm going to refer her to orthopedic surgeon who specializes in hand and upper extremity for further evaluation and treatment.  Patient scheduled to return to the office at the end of the month for her overdue annual exam and she will return in a fasting state and will see what she has decided as well if she wants to continue with her current form of contraception or proceed with a tubal ligation.

## 2013-10-16 ENCOUNTER — Other Ambulatory Visit: Payer: Self-pay | Admitting: Orthopedic Surgery

## 2013-10-16 DIAGNOSIS — M25521 Pain in right elbow: Secondary | ICD-10-CM

## 2013-10-17 ENCOUNTER — Encounter: Payer: Commercial Managed Care - PPO | Admitting: Gynecology

## 2013-10-20 ENCOUNTER — Other Ambulatory Visit: Payer: Commercial Managed Care - PPO

## 2013-11-13 ENCOUNTER — Encounter: Payer: Self-pay | Admitting: Gynecology

## 2013-11-13 ENCOUNTER — Ambulatory Visit (INDEPENDENT_AMBULATORY_CARE_PROVIDER_SITE_OTHER): Payer: Commercial Managed Care - PPO | Admitting: Gynecology

## 2013-11-13 VITALS — BP 118/76 | Ht 61.5 in | Wt 172.0 lb

## 2013-11-13 DIAGNOSIS — Z23 Encounter for immunization: Secondary | ICD-10-CM

## 2013-11-13 DIAGNOSIS — Z01419 Encounter for gynecological examination (general) (routine) without abnormal findings: Secondary | ICD-10-CM

## 2013-11-13 DIAGNOSIS — Z113 Encounter for screening for infections with a predominantly sexual mode of transmission: Secondary | ICD-10-CM

## 2013-11-13 LAB — CBC WITH DIFFERENTIAL/PLATELET
BASOS PCT: 1 % (ref 0–1)
Basophils Absolute: 0.1 10*3/uL (ref 0.0–0.1)
Eosinophils Absolute: 0.1 10*3/uL (ref 0.0–0.7)
Eosinophils Relative: 1 % (ref 0–5)
HCT: 37.8 % (ref 36.0–46.0)
Hemoglobin: 12.9 g/dL (ref 12.0–15.0)
Lymphocytes Relative: 34 % (ref 12–46)
Lymphs Abs: 2.9 10*3/uL (ref 0.7–4.0)
MCH: 29.5 pg (ref 26.0–34.0)
MCHC: 34.1 g/dL (ref 30.0–36.0)
MCV: 86.3 fL (ref 78.0–100.0)
MONOS PCT: 5 % (ref 3–12)
Monocytes Absolute: 0.4 10*3/uL (ref 0.1–1.0)
NEUTROS ABS: 5.1 10*3/uL (ref 1.7–7.7)
NEUTROS PCT: 59 % (ref 43–77)
PLATELETS: 371 10*3/uL (ref 150–400)
RBC: 4.38 MIL/uL (ref 3.87–5.11)
RDW: 13.5 % (ref 11.5–15.5)
WBC: 8.6 10*3/uL (ref 4.0–10.5)

## 2013-11-13 LAB — COMPREHENSIVE METABOLIC PANEL
ALBUMIN: 4 g/dL (ref 3.5–5.2)
ALK PHOS: 68 U/L (ref 39–117)
ALT: 14 U/L (ref 0–35)
AST: 15 U/L (ref 0–37)
BILIRUBIN TOTAL: 0.4 mg/dL (ref 0.2–1.2)
BUN: 6 mg/dL (ref 6–23)
CO2: 24 mEq/L (ref 19–32)
Calcium: 8.6 mg/dL (ref 8.4–10.5)
Chloride: 105 mEq/L (ref 96–112)
Creat: 0.62 mg/dL (ref 0.50–1.10)
GLUCOSE: 84 mg/dL (ref 70–99)
POTASSIUM: 3.4 meq/L — AB (ref 3.5–5.3)
SODIUM: 138 meq/L (ref 135–145)
TOTAL PROTEIN: 7 g/dL (ref 6.0–8.3)

## 2013-11-13 LAB — TSH: TSH: 1.329 u[IU]/mL (ref 0.350–4.500)

## 2013-11-13 LAB — LIPID PANEL
Cholesterol: 160 mg/dL (ref 0–200)
HDL: 35 mg/dL — ABNORMAL LOW (ref >39)
LDL CALC: 90 mg/dL (ref 0–99)
TRIGLYCERIDES: 174 mg/dL — AB (ref <150)
Total CHOL/HDL Ratio: 4.6 Ratio
VLDL: 35 mg/dL (ref 0–40)

## 2013-11-13 NOTE — Patient Instructions (Signed)
Influenza Virus Vaccine injection (Fluarix) Qu es este medicamento? La VACUNA ANTIGRIPAL ayuda a disminuir el riesgo de contraer la influenza, tambin conocida como la gripe. La vacuna solo ayuda a protegerle contra algunas cepas de influenza. Esta vacuna no ayuda a reducir el riesgo de contraer influenza pandmica H1N1. Este medicamento puede ser utilizado para otros usos; si tiene alguna pregunta consulte con su proveedor de atencin mdica o con su farmacutico. MARCAS COMERCIALES DISPONIBLES: Fluarix, Fluzone Qu le debo informar a mi profesional de la salud antes de tomar este medicamento? Necesita saber si usted presenta alguno de los siguientes problemas o situaciones: -trastorno de sangrado como hemofilia -fiebre o infeccin -sndrome de Guillain-Barre u otros problemas neurolgicos -problemas del sistema inmunolgico -infeccin por el virus de la inmunodeficiencia humana (VIH) o SIDA -niveles bajos de plaquetas en la sangre -esclerosis mltiple -una reaccin alrgica o inusual a las vacunas antigripales, a los huevos, protenas de pollo, al ltex, a la gentamicina, a otros medicamentos, alimentos, colorantes o conservantes -si est embarazada o buscando quedar embarazada -si est amamantando a un beb Cmo debo utilizar este medicamento? Esta vacuna se administra mediante inyeccin por va intramuscular. Lo administra un profesional de la salud. Recibir una copia de informacin escrita sobre la vacuna antes de cada vacuna. Asegrese de leer este folleto cada vez cuidadosamente. Este folleto puede cambiar con frecuencia. Hable con su pediatra para informarse acerca del uso de este medicamento en nios. Puede requerir atencin especial. Sobredosis: Pngase en contacto inmediatamente con un centro toxicolgico o una sala de urgencia si usted cree que haya tomado demasiado medicamento. ATENCIN: Este medicamento es solo para usted. No comparta este medicamento con nadie. Qu sucede  si me olvido de una dosis? No se aplica en este caso. Qu puede interactuar con este medicamento? -quimioterapia o radioterapia -medicamentos que suprimen el sistema inmunolgico, tales como etanercept, anakinra, infliximab y adalimumab -medicamentos que tratan o previenen cogulos sanguneos, como warfarina -fenitona -medicamentos esteroideos, como la prednisona o la cortisona -teofilina -vacunas Puede ser que esta lista no menciona todas las posibles interacciones. Informe a su profesional de la salud de todos los productos a base de hierbas, medicamentos de venta libre o suplementos nutritivos que est tomando. Si usted fuma, consume bebidas alcohlicas o si utiliza drogas ilegales, indqueselo tambin a su profesional de la salud. Algunas sustancias pueden interactuar con su medicamento. A qu debo estar atento al usar este medicamento? Informe a su mdico o a su profesional de la salud sobre todos los efectos secundarios que persistan despus de 3 das. Llame a su proveedor de atencin mdica si se presentan sntomas inusuales dentro de las 6 semanas posteriores a la vacunacin. Es posible que todava pueda contraer la gripe, pero la enfermedad no ser tan fuerte como normalmente. No puede contraer la gripe de esta vacuna. La vacuna antigripal no le protege contra resfros u otras enfermedades que pueden causar fiebre. Debe vacunarse cada ao. Qu efectos secundarios puedo tener al utilizar este medicamento? Efectos secundarios que debe informar a su mdico o a su profesional de la salud tan pronto como sea posible: -reacciones alrgicas como erupcin cutnea, picazn o urticarias, hinchazn de la cara, labios o lengua Efectos secundarios que, por lo general, no requieren atencin mdica (debe informarlos a su mdico o a su profesional de la salud si persisten o si son molestos): -fiebre -dolor de cabeza -molestias y dolores musculares -dolor, sensibilidad, enrojecimiento o hinchazn en  el lugar de la inyeccin -cansancio o debilidad Puede ser que esta lista   no menciona todos los posibles efectos secundarios. Comunquese a su mdico por asesoramiento mdico sobre los efectos secundarios. Usted puede informar los efectos secundarios a la FDA por telfono al 1-800-FDA-1088. Dnde debo guardar mi medicina? Esta vacuna se administra solamente en clnicas, farmacias, consultorio mdico u otro consultorio de un profesional de la salud y no necesitar guardarlo en su domicilio. ATENCIN: Este folleto es un resumen. Puede ser que no cubra toda la posible informacin. Si usted tiene preguntas acerca de esta medicina, consulte con su mdico, su farmacutico o su profesional de la salud.  2015, Elsevier/Gold Standard. (2009-07-06 15:31:40)  

## 2013-11-13 NOTE — Progress Notes (Signed)
Sheryl ClarksRocio Logan 23-Jun-1975 [id number]   History:    38 y.o.  for annual gyn exam with no complaints today. Patient had noticed a mass on the medial aspect of her right arm has gotten bigger and is tender to touch at times she feels some numbness in her fingers. She denies any recent injury. She is a gravida 6 para 4 AB 2. She had a ParaGard T380A IUD placed 2012. I had referred her to the orthopedic upper extremity specialists and he wanted to order an MRI but patient is not done so because of cost.Review of her record indicated that several years ago she had been treated for trichomoniasis and chlamydia.Patient denies any prior history of abnormal Pap smears. Patient had an uncomplicated pregnancy is at term. All pregnancies delivered vaginally.    Past medical history,surgical history, family history and social history were all reviewed and documented in the EPIC chart.  Gynecologic History Patient's last menstrual period was 10/28/2013. Contraception: IUD Last Pap: 2014. Results were: normal Last mammogram: Not indicated. Results were: Not indicated  Obstetric History OB History  Gravida Para Term Preterm AB SAB TAB Ectopic Multiple Living  6 4   1 1    4     # Outcome Date GA Lbr Len/2nd Weight Sex Delivery Anes PTL Lv  6 SAB           5 PAR           4 PAR           3 PAR           2 PAR           1 GRA                ROS: A ROS was performed and pertinent positives and negatives are included in the history.  GENERAL: No fevers or chills. HEENT: No change in vision, no earache, sore throat or sinus congestion. NECK: No pain or stiffness. CARDIOVASCULAR: No chest pain or pressure. No palpitations. PULMONARY: No shortness of breath, cough or wheeze. GASTROINTESTINAL: No abdominal pain, nausea, vomiting or diarrhea, melena or bright red blood per rectum. GENITOURINARY: No urinary frequency, urgency, hesitancy or dysuria. MUSCULOSKELETAL: Right SOFT TISSUE MASS DERMATOLOGIC: No  rash, no itching, no lesions. ENDOCRINE: No polyuria, polydipsia, no heat or cold intolerance. No recent change in weight. HEMATOLOGICAL: No anemia or easy bruising or bleeding. NEUROLOGIC: No headache, seizures, numbness, tingling or weakness. PSYCHIATRIC: No depression, no loss of interest in normal activity or change in sleep pattern.     Exam: chaperone present  BP 118/76  Ht 5' 1.5" (1.562 m)  Wt 172 lb (78.019 kg)  BMI 31.98 kg/m2  LMP 10/28/2013  Body mass index is 31.98 kg/(m^2).  General appearance : Well developed well nourished female. No acute distress HEENT: Neck supple, trachea midline, no carotid bruits, no thyroidmegaly Lungs: Clear to auscultation, no rhonchi or wheezes, or rib retractions  Heart: Regular rate and rhythm, no murmurs or gallops Breast:Examined in sitting and supine position were symmetrical in appearance, no palpable masses or tenderness,  no skin retraction, no nipple inversion, no nipple discharge, no skin discoloration, no axillary or supraclavicular lymphadenopathy Abdomen: no palpable masses or tenderness, no rebound or guarding Extremities: no edema or skin discoloration or tenderness  Pelvic:  Bartholin, Urethra, Skene Glands: Within normal limits             Vagina: No gross lesions or discharge  Cervix: No gross  lesions or discharge, IUD string seen  Uterus  anteverted, normal size, shape and consistency, non-tender and mobile  Adnexa  Without masses or tenderness  Anus and perineum  normal   Rectovaginal  normal sphincter tone without palpated masses or tenderness             Hemoccult not indicated     Assessment/Plan:  38 y.o. female for annual exam and was recommended she follow-up with the orthopedic surgeon because of the soft tissue mass which was still present. She still complains at times of numbness and tingling on her right hand. The following labs ordered today: Because of her past history of trichomoniasis and chlamydia with her  current partner I'm going to repeat a GC and Chlamydia culture today along with an HIV blood test in the following labs: CBC, fasting lipid profile, comprehensive metabolic panel, TSH and urinalysis. Patient received the flu vaccine today. Next year she will need a baseline mammogram. We discussed importance of monthly breast exams.   Ok EdwardsFERNANDEZ,JUAN H MD, 4:47 PM 11/13/2013

## 2013-11-14 LAB — GC/CHLAMYDIA PROBE AMP
CT PROBE, AMP APTIMA: NEGATIVE
GC PROBE AMP APTIMA: NEGATIVE

## 2013-11-14 LAB — HIV ANTIBODY (ROUTINE TESTING W REFLEX): HIV: NONREACTIVE

## 2013-11-17 ENCOUNTER — Encounter: Payer: Self-pay | Admitting: Gynecology

## 2014-11-16 ENCOUNTER — Encounter: Payer: Self-pay | Admitting: Gynecology

## 2014-11-16 ENCOUNTER — Ambulatory Visit (INDEPENDENT_AMBULATORY_CARE_PROVIDER_SITE_OTHER): Payer: Commercial Managed Care - PPO | Admitting: Gynecology

## 2014-11-16 VITALS — BP 120/78 | Ht 61.0 in | Wt 181.0 lb

## 2014-11-16 DIAGNOSIS — Z113 Encounter for screening for infections with a predominantly sexual mode of transmission: Secondary | ICD-10-CM | POA: Diagnosis not present

## 2014-11-16 DIAGNOSIS — R2231 Localized swelling, mass and lump, right upper limb: Secondary | ICD-10-CM | POA: Diagnosis not present

## 2014-11-16 DIAGNOSIS — Z23 Encounter for immunization: Secondary | ICD-10-CM

## 2014-11-16 DIAGNOSIS — Z01419 Encounter for gynecological examination (general) (routine) without abnormal findings: Secondary | ICD-10-CM | POA: Diagnosis not present

## 2014-11-16 NOTE — Patient Instructions (Signed)
Control del colesterol  Los niveles de colesterol en el organismo estn determinados significativamente por su dieta. Los niveles de colesterol tambin se relacionan con la enfermedad cardaca. El material que sigue ayuda a explicar esta relacin y a analizar qu puede hacer para mantener su corazn sano. No todo el colesterol es malo. Las lipoprotenas de baja densidad (LDL) forman el colesterol "malo". El colesterol malo puede ocasionar depsitos de grasa que se acumulan en el interior de las arterias. Las lipoprotenas de alta densidad (HDL) es el colesterol "bueno". Ayuda a remover el colesterol LDL "malo" de la sangre. El colesterol es un factor de riesgo muy importante para la enfermedad cardaca. Otros factores de riesgo son la hipertensin arterial, el hbito de fumar, el estrs, la herencia y el peso.   El msculo cardaco obtiene el suministro de sangre a travs de las arterias coronarias. Si su colesterol LDL ("malo") est elevado y el HDL ("bueno") es bajo, tiene un factor de riesgo para que se formen depsitos de grasa en las arterias coronarias (los vasos sanguneos que suministran sangre al corazn). Esto hace que haya menos lugar para que la sangre circule. Sin la suficiente sangre y oxgeno, el msculo cardaco no puede funcionar correctamente, y usted podr sentir dolores en el pecho (angina pectoris). Cuando una arteria coronaria se cierra completamente, una parte del msculo cardaco puede morir (infarto de miocardio).  CONTROL DEL COLESTEROL Cuando el profesional que lo asiste enva la sangre al laboratorio para conocer el nivel de colesterol, puede realizarle tambin un perfil completo de los lpidos. Con esta prueba, se puede determinar la cantidad total de colesterol, as como los niveles de LDL y HDL. Los triglicridos son un tipo de grasa que circula en la sangre y que tambin puede utilizarse para determinar el riesgo de enfermedad  cardaca. En la siguiente tabla se establecen los nmeros ideales: Prueba: Colesterol total  Menos de 200 mg/dl.  Prueba: LDL "colesterol malo"  Menos de 100 mg/dl.   Menos de 70 mg/dl si tiene riesgo muy elevado de sufrir un ataque cardaco o muerte cardaca sbita.  Prueba: HDL "colesterol bueno"  Mujeres: Ms de 50 mg/dl.   Hombres: Ms de 40 mg/dl.  Prueba: Trigliceridos  Menos de 150 mg/dl.    CONTROL DEL COLESTEROL CON DIETA Aunque factores como el ejercicio y el estilo de vida son importantes, la "primera lnea de ataque" es la dieta. Esto se debe a que se sabe que ciertos alimentos hacen subir el colesterol y otros lo bajan. El objetivo debe ser equilibrar los alimentos, de modo que tengan un efecto sobre el colesterol y, an ms importante, reemplazar las grasas saturadas y trans con otros tipos de grasas, como las monoinsaturadas y las poliinsaturadas y cidos grasos omega-3 . En promedio, una persona no debe consumir ms de 15 a 17 g de grasas saturadas por da. Las grasas saturadas y trans se consideran grasas "malas", ya que elevan el colesterol LDL. Las grasas saturadas se encuentran principalmente en productos animales como carne, manteca y crema. Pero esto no significa que usted debe sacrificar todas sus comidas favoritas. Actualmente, como lo muestra el cuadro que figura al final de este documento, hay sustitutos de buen sabor, bajos en grasas y en colesterol, para la mayora de los alimentos que a usted le gusta comer. Elija aquellos alimentos alternativos que sean bajos en grasas o sin grasas. Elija cortes de carne del cuarto trasero o lomo ya que estos cortes son los que tienen menor cantidad de grasa   y colesterol. El pollo (sin piel), el pescado, la carne de ternera, y la pechuga de pavo molida son excelentes opciones. Elimine las carnes grasosas como los hotdogs o el salami. Los mariscos tienen poco o nada de grasas saturadas. Cuando consuma carne magra, carne de aves de  corral, o pescado, hgalo en porciones de 85 gramos (3 onzas). Las grasas trans tambin se llaman "aceites parcialmente hidrogenados". Son aceites manipulados cientficamente de modo que son slidos a temperatura ambiente, tienen una larga vida y mejoran el sabor y la textura de los alimentos a los que se agregan. Las grasas trans se encuentran en la margarina, masitas, crackers y alimentos horneados.  Para hornear y cocinar, el aceite es un excelente sustituto para la mantequilla. Los aceites monoinsaturados tienen un beneficio particular, ya que se cree que disminuyen el colesterol LDL (colesterol malo) y elevan el HDL. Deber evitar los aceites tropicales saturados como el de coco y el de palma.  Recuerde, adems, que puede comer sin restricciones los grupos de alimentos que son naturalmente libres de grasas saturadas y grasas trans, entre los que se incluyen el pescado, las frutas (excepto el aguacate), verduras, frijoles, cereales (cebada, arroz, cuzcuz, trigo) y las pastas (sin salsas con crema)   IDENTIFIQUE LOS ALIMENTOS QUE DISMINUYEN EL COLESTEROL  Pueden disminuir el colesterol las fibras solubles que estn en las frutas, como las manzanas, en los vegetales como el brcoli, las patatas y las zanahorias; en las legumbres como frijoles, guisantes y lentejas; y en los cereales como la cebada. Los alimentos fortificados con fitosteroles tambin pueden disminuir el colesterol. Debe consumir al menos 2 g de estos alimentos a diario para obtener el efecto de disminucin de colesterol.  En el supermercado, lea las etiquetas de los envases para identificar los alimentos bajos en grasas saturadas, libres de grasas trans y bajos en grasas, . Elija quesos que tengan solo de 2 a 3 g de grasa saturada por onza (28,35 g). Use una margarina que no dae el corazn, libre de grasas trans o aceite parcialmente hidrogenado. Al comprar alimentos horneados (galletitas dulces y galletas) evite el aceite parcialmente  hidrogenado. Los panes y bollos debern ser de granos enteros (harina de maz o de avena entera, en lugar de "harina" o "harina enriquecida"). Compre sopas en lata que no sean cremosas, con bajo contenido de sal y sin grasas adicionadas.   TCNICAS DE PREPARACIN DE LOS ALIMENTOS  Nunca fra los alimentos en aceite abundante. Si debe frer, hgalo en poco aceite y removiendo constantemente, porque as se utilizan muy pocas grasas, o utilice un spray antiadherente. Cuando le sea posible, hierva, hornee o ase las carnes y cocine los vegetales al vapor. En vez de aderezar los vegetales con mantequilla o margarina, utilice limn y hierbas, pur de manzanas y canela (para las calabazas y batatas), yogurt y salsa descremados y aderezos para ensaladas bajos en contenido graso.   BAJO EN GRASAS SATURADAS / SUSTITUTOS BAJOS EN GRASA  Carnes / Grasas saturadas (g)  Evite: Bife, corte graso (3 oz/85 g) / 11 g   Elija: Bife, corte magro (3 oz/85 g) / 4 g   Evite: Hamburguesa (3 oz/85 g) / 7 g   Elija:  [place]amburguesa magra (3 oz/85 g) / 5 g   Evite: Jamn (3 oz/85 g) / 6 g   Elija:  Jamn magro (3 oz/85 g) / 2.4 g   Evite: Pollo, con piel (3 oz/85 g), Carne oscura / 4 g   Elija:  Pollo, sin piel (  3 oz/85 g), Carne oscura / 2 g   Evite: Pollo, con piel (3 oz/85 g), Carne magra / 2.5 g   Elija: Pollo, sin piel (3 oz/85 g), Carne magra / 1 g  Lcteos / Grasas saturadas (g)  Evite: Leche entera (1 taza) / 5 g   Elija: Leche con bajo contenido de grasa, 2% (1 taza) / 3 g   Elija: Leche con bajo contenido de grasa, 1% (1 taza) / 1.5 g   Elija: Leche descremada (1 taza) / 0.3 g   Evite: Queso duro (1 oz/28 g) / 6 g   Elija: Queso descremado (1 oz/28 g) / 2-3 g   Evite: Queso cottage, 4% grasa (1 taza)/ 6.5 g   Elija: Queso cottage con bajo contenido de grasa, 1% grasa (1 taza)/ 1.5 g   Evite: Helado (1 taza) / 9 g   Elija: Sorbete (1 taza) / 2.5 g   Elija: Yogurt helado sin contenido de  grasa (1 taza) / 0.3 g   Elija: Barras de fruta congeladas / vestigios   Evite: Crema batida (1 cucharada) / 3.5 g   Elija: Batidos glac sin lcteos (1 cucharada) / 1 g  Condimentos / Grasas saturadas (g)  Evite: Mayonesa (1 cucharada) / 2 g   Elija: Mayonesa con bajo contenido de grasa (1 cucharada) / 1 g   Evite: Manteca (1 cucharada) / 7 g   Elija: Margarina extra light (1 cucharada) / 1 g   Evite: Aceite de coco (1 cucharada) / 11.8 g   Elija: Aceite de oliva (1 cucharada) / 1.8 g   Elija: Aceite de maz (1 cucharada) / 1.7 g   Elija: Aceite de crtamo (1 cucharada) / 1.2 g   Elija: Aceite de girasol (1 cucharada) / 1.4 g   Elija: Aceite de soja (1 cucharada) / 2.4 g   Elija: Aceite de canola (1 cucharada) / 1 g  Document Released: 01/02/2005 Document Revised: 09/14/2010 [employer] Patient Information 2012 [employer]. Ejercicios para perder peso (Exercise to Lose Weight) La actividad fsica y una dieta saludable ayudan a perder peso. El mdico podr sugerirle ejercicios especficos. IDEAS Y CONSEJOS PARA HACER EJERCICIOS  Elija opciones econmicas que disfrute hacer , como caminar, andar en bicicleta o los vdeos para ejercitarse.   Utilice las escaleras en lugar del ascensor.   Camine durante la hora del almuerzo.   Estacione el auto lejos del lugar de trabajo o estudio.   Concurra a un gimnasio o tome clases de gimnasia.   Comience con 5  10 minutos de actividad fsica por da. Ejercite hasta 30 minutos, 4 a 6 das por semana.   Utilice zapatos que tengan un buen soporte y ropas cmodas.   Elongue antes y despus de ejercitar.   Ejercite hasta que aumente la respiracin y el corazn palpite rpido.   Beba agua extra cuando ejercite.   No haga ejercicio hasta lastimarse, sentirse mareado o que le falte mucho el aire.  La actividad fsica puede quemar alrededor de 150 caloras.  Correr 20 cuadras en 15 minutos.   Jugar vley durante 45 a 60  minutos.   Limpiar y encerar el auto durante 45 a 60 minutos.   Jugar ftbol americano de toque.   Caminar 25 cuadras en 35 minutos.   Empujar un cochecito 20 cuadras en 30 minutos.   Jugar baloncesto durante 30 minutos.   Rastrillar hojas secas durante 30 minutos.   Andar en bicicleta 80 cuadras en 30 minutos.     Caminar 30 cuadras en 30 minutos.   Bailar durante 30 minutos.   Quitar la nieve con una pala durante 15 minutos.   Nadar vigorosamente durante 20 minutos.   Subir escaleras durante 15 minutos.   Andar en bicicleta 60 cuadras durante 15 minutos.   Arreglar el jardn entre 30 y 45 minutos.   Saltar a la soga durante 15 minutos.   Limpiar vidrios o pisos durante 45 a 60 minutos.  Document Released: 04/08/2010 Document Revised: 09/14/2010 [employer] Patient Information 2012 [employer]. 

## 2014-11-16 NOTE — Progress Notes (Signed)
Margy ClarksRocio Brubeck 1975/05/14 [id number]   History:    39 y.o.  for annual gyn exam with the only complaint is of weight gain. Review of her record indicated she was weighing 172 pounds last years up to 181. Patient is having normal menstrual cycles. Patient had a ParaGard T380A placed in 2012. Patient several years ago had been treated for trichomoniasis as well as for Chlamydia infection. Last year she had a negative for STD screening. Patient with no change in sexual partners. Patient with no previous history of any abnormal Pap smear. Patient with no mammogram as of yet.  Past medical history,surgical history, family history and social history were all reviewed and documented in the EPIC chart.  Gynecologic History Patient's last menstrual period was 11/10/2014. Contraception: IUD Last Pap: 2014. Results were: normal Last mammogram: No prior study. Results were: No prior study  Obstetric History OB History  Gravida Para Term Preterm AB SAB TAB Ectopic Multiple Living  6 4   1 1    4     # Outcome Date GA Lbr Len/2nd Weight Sex Delivery Anes PTL Lv  6 SAB           5 Para           4 Para           3 Para           2 Para           1 Gravida                ROS: A ROS was performed and pertinent positives and negatives are included in the history.  GENERAL: No fevers or chills. HEENT: No change in vision, no earache, sore throat or sinus congestion. NECK: No pain or stiffness. CARDIOVASCULAR: No chest pain or pressure. No palpitations. PULMONARY: No shortness of breath, cough or wheeze. GASTROINTESTINAL: No abdominal pain, nausea, vomiting or diarrhea, melena or bright red blood per rectum. GENITOURINARY: No urinary frequency, urgency, hesitancy or dysuria. MUSCULOSKELETAL: No joint or muscle pain, no back pain, no recent trauma. DERMATOLOGIC: No rash, no itching, no lesions. ENDOCRINE: No polyuria, polydipsia, no heat or cold intolerance. No recent change in weight. HEMATOLOGICAL: No  anemia or easy bruising or bleeding. NEUROLOGIC: No headache, seizures, numbness, tingling or weakness. PSYCHIATRIC: No depression, no loss of interest in normal activity or change in sleep pattern.     Exam: chaperone present  BP 120/78 mmHg  Ht 5\' 1"  (1.549 m)  Wt 181 lb (82.101 kg)  BMI 34.22 kg/m2  LMP 11/10/2014  Body mass index is 34.22 kg/(m^2).  General appearance : Well developed well nourished female. No acute distress HEENT: Eyes: no retinal hemorrhage or exudates,  Neck supple, trachea midline, no carotid bruits, no thyroidmegaly Lungs: Clear to auscultation, no rhonchi or wheezes, or rib retractions  Heart: Regular rate and rhythm, no murmurs or gallops Breast:Examined in sitting and supine position were symmetrical in appearance, no palpable masses or tenderness,  no skin retraction, no nipple inversion, no nipple discharge, no skin discoloration, no axillary or supraclavicular lymphadenopathy Abdomen: no palpable masses or tenderness, no rebound or guarding Extremities: no edema or skin discoloration or tenderness  Pelvic:  Bartholin, Urethra, Skene Glands: Within normal limits             Vagina: No gross lesions or discharge  Cervix: No gross lesions or discharge, IUD string seen  Uterus  anteverted, normal size, shape and consistency, non-tender and mobile  Adnexa  Without masses or tenderness  Anus and perineum  normal   Rectovaginal  normal sphincter tone without palpated masses or tenderness             Hemoccult not indicated     Assessment/Plan:  39 y.o. female for annual exam will be provided with literature information on cholesterol lowering diet as well as on exercising Spanish. Patient will return back tomorrow and a fasting state for the following screening blood work: Comprehensive metabolic panel, fasting lipid profile, TSH, CBC, and urinalysis. Because her past history of drug moniliasis and Chlamydia in the past where going to do a GC and Chlamydia  culture today. Pap smear not indicated. Patient was provided with requisition to schedule her baseline mammogram.   Ok Edwards MD, 3:50 PM 11/16/2014

## 2014-11-17 ENCOUNTER — Other Ambulatory Visit: Payer: Self-pay | Admitting: *Deleted

## 2014-11-17 ENCOUNTER — Other Ambulatory Visit: Payer: Commercial Managed Care - PPO

## 2014-11-17 DIAGNOSIS — R319 Hematuria, unspecified: Secondary | ICD-10-CM

## 2014-11-17 LAB — LIPID PANEL
CHOLESTEROL: 169 mg/dL (ref 125–200)
HDL: 33 mg/dL — ABNORMAL LOW (ref >=46)
LDL Cholesterol: 102 mg/dL (ref <130)
Total CHOL/HDL Ratio: 5.1 Ratio — ABNORMAL HIGH (ref <=5.0)
Triglycerides: 172 mg/dL — ABNORMAL HIGH (ref <150)
VLDL: 34 mg/dL — AB (ref <30)

## 2014-11-17 LAB — COMPREHENSIVE METABOLIC PANEL
ALK PHOS: 59 U/L (ref 33–115)
ALT: 18 U/L (ref 6–29)
AST: 17 U/L (ref 10–30)
Albumin: 3.6 g/dL (ref 3.6–5.1)
BILIRUBIN TOTAL: 0.5 mg/dL (ref 0.2–1.2)
BUN: 8 mg/dL (ref 7–25)
CALCIUM: 9 mg/dL (ref 8.6–10.2)
CO2: 27 mmol/L (ref 20–31)
Chloride: 105 mmol/L (ref 98–110)
Creat: 0.73 mg/dL (ref 0.50–1.10)
Glucose, Bld: 91 mg/dL (ref 65–99)
Potassium: 3.7 mmol/L (ref 3.5–5.3)
Sodium: 139 mmol/L (ref 135–146)
TOTAL PROTEIN: 7.1 g/dL (ref 6.1–8.1)

## 2014-11-17 LAB — CBC WITH DIFFERENTIAL/PLATELET
BASOS ABS: 0 10*3/uL (ref 0.0–0.1)
Basophils Relative: 0 % (ref 0–1)
EOS PCT: 3 % (ref 0–5)
Eosinophils Absolute: 0.2 10*3/uL (ref 0.0–0.7)
HEMATOCRIT: 38.7 % (ref 36.0–46.0)
HEMOGLOBIN: 13.1 g/dL (ref 12.0–15.0)
LYMPHS ABS: 3 10*3/uL (ref 0.7–4.0)
Lymphocytes Relative: 45 % (ref 12–46)
MCH: 30.2 pg (ref 26.0–34.0)
MCHC: 33.9 g/dL (ref 30.0–36.0)
MCV: 89.2 fL (ref 78.0–100.0)
MPV: 9 fL (ref 8.6–12.4)
Monocytes Absolute: 0.3 10*3/uL (ref 0.1–1.0)
Monocytes Relative: 5 % (ref 3–12)
NEUTROS ABS: 3.1 10*3/uL (ref 1.7–7.7)
Neutrophils Relative %: 47 % (ref 43–77)
Platelets: 343 10*3/uL (ref 150–400)
RBC: 4.34 MIL/uL (ref 3.87–5.11)
RDW: 12.9 % (ref 11.5–15.5)
WBC: 6.6 10*3/uL (ref 4.0–10.5)

## 2014-11-17 LAB — URINALYSIS W MICROSCOPIC + REFLEX CULTURE
Bacteria, UA: NONE SEEN [HPF]
Bilirubin Urine: NEGATIVE
CASTS: NONE SEEN [LPF]
Crystals: NONE SEEN [HPF]
Glucose, UA: NEGATIVE
Ketones, ur: NEGATIVE
Leukocytes, UA: NEGATIVE
NITRITE: NEGATIVE
PH: 6 (ref 5.0–8.0)
Protein, ur: NEGATIVE
RBC / HPF: NONE SEEN RBC/HPF (ref <=2)
SPECIFIC GRAVITY, URINE: 1.015 (ref 1.001–1.035)
WBC, UA: NONE SEEN WBC/HPF (ref <=5)
YEAST: NONE SEEN [HPF]

## 2014-11-17 LAB — GC/CHLAMYDIA PROBE AMP
CT PROBE, AMP APTIMA: NEGATIVE
GC PROBE AMP APTIMA: NEGATIVE

## 2014-11-17 LAB — TSH: TSH: 2.452 u[IU]/mL (ref 0.350–4.500)

## 2014-11-18 ENCOUNTER — Other Ambulatory Visit: Payer: Self-pay | Admitting: Gynecology

## 2014-11-18 ENCOUNTER — Emergency Department (INDEPENDENT_AMBULATORY_CARE_PROVIDER_SITE_OTHER): Payer: Commercial Managed Care - PPO

## 2014-11-18 ENCOUNTER — Encounter (HOSPITAL_COMMUNITY): Payer: Self-pay | Admitting: Emergency Medicine

## 2014-11-18 ENCOUNTER — Emergency Department (HOSPITAL_COMMUNITY)
Admission: EM | Admit: 2014-11-18 | Discharge: 2014-11-18 | Disposition: A | Discharge status: Home / Self Care | Payer: Commercial Managed Care - PPO | Source: Home / Self Care

## 2014-11-18 DIAGNOSIS — S90121A Contusion of right lesser toe(s) without damage to nail, initial encounter: Secondary | ICD-10-CM | POA: Diagnosis not present

## 2014-11-18 DIAGNOSIS — E7800 Pure hypercholesterolemia, unspecified: Secondary | ICD-10-CM

## 2014-11-18 MED ORDER — IBUPROFEN 800 MG PO TABS
800.0000 mg | ORAL_TABLET | Freq: Three times a day (TID) | ORAL | Status: DC
Start: 2014-11-18 — End: 2015-12-14

## 2014-11-18 NOTE — Discharge Instructions (Signed)
Contusin (Contusion) Una contusin es un hematoma profundo. Las contusiones son el resultado de un traumatismo cerrado en los tejidos y las fibras musculares que estn debajo de la piel. La lesin causa una hemorragia debajo de la piel. La piel sobre la contusin puede tornarse de color azul, morado o amarillo. Las lesiones menores causarn contusiones sin dolor, pero las ms graves pueden presentar dolor e inflamacin durante un par de semanas.  CAUSAS  Generalmente, esta afeccin se debe a un golpe, un traumatismo o una fuerza directa en una zona del cuerpo. SNTOMAS  Los sntomas de esta afeccin incluyen lo siguiente:  Hinchazn de la zona lesionada.  Dolor y sensibilidad en la zona de la lesin.  Cambio de color. La zona puede enrojecerse y luego ponerse azul, morada o amarilla. DIAGNSTICO  Esta afeccin se diagnostica en funcin de un examen fsico y de la historia clnica. Puede ser necesario hacer una radiografa, una tomografa computarizada (TC) o una resonancia magntica (RM) para determinar si hubo lesiones asociadas, como huesos rotos (fracturas). TRATAMIENTO  El tratamiento especfico de esta afeccin depender de la zona del cuerpo donde se produjo la lesin. En general, el mejor tratamiento para una contusin es el reposo, la aplicacin de hielo, la compresin y la elevacin de la zona de la lesin. Generalmente, esto se conoce como la estrategia de RHCE. Para controlar el dolor, tambin pueden recomendarle antiinflamatorios de venta libre.  INSTRUCCIONES PARA EL CUIDADO EN EL HOGAR   Mantenga la zona de la lesin en reposo.  Si se lo indican, aplique hielo sobre la zona lesionada:  Ponga el hielo en una bolsa plstica.  Coloque una toalla entre la piel y la bolsa de hielo.  Coloque el hielo durante 20minutos, 2 a 3veces por da.  Si se lo indican, ejerza una compresin suave en la zona de la lesin con una venda elstica. Asegrese de que la venda no est muy  ajustada. Qutese y vuelva a colocarse la venda como se lo haya indicado el mdico.  Cuando est sentado o acostado, eleve la zona de la lesin por encima del nivel del corazn, si es posible.  Tome los medicamentos de venta libre y los recetados solamente como se lo haya indicado el mdico. SOLICITE ATENCIN MDICA SI:  Los sntomas no mejoran despus de varios das de tratamiento.  Los sntomas empeoran.  Tiene dificultad para mover la zona lesionada. SOLICITE ATENCIN MDICA DE INMEDIATO SI:   Siente dolor intenso.  Siente adormecimiento en una mano o un pie.  La mano o el pie estn plidos o fros.   Esta informacin no tiene como fin reemplazar el consejo del mdico. Asegrese de hacerle al mdico cualquier pregunta que tenga.   Document Released: 10/12/2004 Document Revised: 09/23/2014 Elsevier Interactive Patient Education 2016 Elsevier Inc.  

## 2014-11-18 NOTE — ED Notes (Signed)
Right little toe injury that occurred yesterday.  Stumped toe.  Pain continues since then.

## 2014-11-18 NOTE — ED Notes (Signed)
Bed: UC05 Expected date:  Expected time:  Means of arrival:  Comments: Nasty

## 2014-11-18 NOTE — ED Provider Notes (Signed)
CSN: [id number]     Arrival date & time 11/18/14  1529 History   None    Chief Complaint  Patient presents with  . Toe Injury   (Consider location/radiation/quality/duration/timing/severity/associated sxs/prior Treatment) The history is provided by the patient.    Past Medical History  Diagnosis Date  . Chlamydia infection 05/05/09  . Trichomonas infection    Past Surgical History  Procedure Laterality Date  . Intrauterine device (iud) insertion      Paragard 2012   Family History  Problem Relation Age of Onset  . Hypertension Mother   . Hyperlipidemia Mother    Social History  Substance Use Topics  . Smoking status: Never Smoker   . Smokeless tobacco: Never Used  . Alcohol Use: No   OB History    Gravida Para Term Preterm AB TAB SAB Ectopic Multiple Living   6 4   1  1   4      Review of Systems  Constitutional: Negative.   HENT: Negative.   Eyes: Negative.   Respiratory: Negative.   Cardiovascular: Negative.   Gastrointestinal: Negative.   Endocrine: Negative.   Genitourinary: Negative.   Musculoskeletal: Positive for joint swelling.  Allergic/Immunologic: Negative.   Neurological: Negative.   Hematological: Negative.   Psychiatric/Behavioral: Negative.     Allergies  Review of patient's allergies indicates no known allergies.  Home Medications   Prior to Admission medications   Not on File   Meds Ordered and Administered this Visit  Medications - No data to display  BP 117/68 mmHg  Pulse 61  Temp(Src) 98.1 F (36.7 C) (Oral)  Resp 16  SpO2 100%  LMP 11/10/2014 No data found.   Physical Exam  Constitutional: She appears well-developed and well-nourished.  HENT:  Head: Normocephalic and atraumatic.  Left Ear: External ear normal.  Mouth/Throat: Oropharynx is clear and moist.  Eyes: Conjunctivae and EOM are normal. Pupils are equal, round, and reactive to light.  Neck: Normal range of motion. Neck supple.  Cardiovascular: Normal rate,  regular rhythm and normal heart sounds.   Pulmonary/Chest: Effort normal and breath sounds normal.  Musculoskeletal: She exhibits tenderness.  Right 5th toe swollen and tender.    ED Course  Procedures (including critical care time)  Labs Review Labs Reviewed - No data to display  Imaging Review No results found.   Visual Acuity Review  Right Eye Distance:   Left Eye Distance:   Bilateral Distance:    Right Eye Near:   Left Eye Near:    Bilateral Near:         MDM  Contusion right 5th toe Xray negative for fracture. Motrin 800mg  one po tid prn pain Note for work Follow up prn.  Anselm PancoastWilliam J Oxford FNP    Deatra CanterWilliam J Oxford, FNP 11/18/14 1710

## 2014-11-20 ENCOUNTER — Other Ambulatory Visit: Payer: Self-pay

## 2014-11-20 DIAGNOSIS — Z1231 Encounter for screening mammogram for malignant neoplasm of breast: Secondary | ICD-10-CM

## 2014-12-08 ENCOUNTER — Ambulatory Visit
Admission: RE | Admit: 2014-12-08 | Discharge: 2014-12-08 | Disposition: A | Discharge status: Home / Self Care | Payer: Commercial Managed Care - PPO | Source: Ambulatory Visit

## 2014-12-08 DIAGNOSIS — Z1231 Encounter for screening mammogram for malignant neoplasm of breast: Secondary | ICD-10-CM

## 2015-02-08 ENCOUNTER — Other Ambulatory Visit: Payer: Self-pay | Admitting: Orthopedic Surgery

## 2015-02-08 DIAGNOSIS — R2231 Localized swelling, mass and lump, right upper limb: Secondary | ICD-10-CM

## 2015-02-19 ENCOUNTER — Inpatient Hospital Stay: Admission: RE | Admit: 2015-02-19 | Payer: Commercial Managed Care - PPO | Source: Ambulatory Visit

## 2015-11-17 ENCOUNTER — Ambulatory Visit (INDEPENDENT_AMBULATORY_CARE_PROVIDER_SITE_OTHER): Payer: Commercial Managed Care - PPO | Admitting: Gynecology

## 2015-11-17 ENCOUNTER — Other Ambulatory Visit: Payer: Self-pay | Admitting: Gynecology

## 2015-11-17 ENCOUNTER — Encounter: Payer: Self-pay | Admitting: Gynecology

## 2015-11-17 VITALS — BP 120/82 | Ht 60.5 in | Wt 188.0 lb

## 2015-11-17 DIAGNOSIS — R635 Abnormal weight gain: Secondary | ICD-10-CM

## 2015-11-17 DIAGNOSIS — E782 Mixed hyperlipidemia: Secondary | ICD-10-CM

## 2015-11-17 DIAGNOSIS — Z23 Encounter for immunization: Secondary | ICD-10-CM

## 2015-11-17 DIAGNOSIS — Z01411 Encounter for gynecological examination (general) (routine) with abnormal findings: Secondary | ICD-10-CM | POA: Diagnosis not present

## 2015-11-17 DIAGNOSIS — Z1231 Encounter for screening mammogram for malignant neoplasm of breast: Secondary | ICD-10-CM

## 2015-11-17 LAB — CBC WITH DIFFERENTIAL/PLATELET
Basophils Absolute: 0 cells/uL (ref 0–200)
Basophils Relative: 0 %
EOS PCT: 2 %
Eosinophils Absolute: 130 cells/uL (ref 15–500)
HCT: 39.5 % (ref 35.0–45.0)
Hemoglobin: 13.1 g/dL (ref 11.7–15.5)
LYMPHS PCT: 48 %
Lymphs Abs: 3120 cells/uL (ref 850–3900)
MCH: 29.8 pg (ref 27.0–33.0)
MCHC: 33.2 g/dL (ref 32.0–36.0)
MCV: 90 fL (ref 80.0–100.0)
MPV: 9 fL (ref 7.5–12.5)
Monocytes Absolute: 390 cells/uL (ref 200–950)
Monocytes Relative: 6 %
NEUTROS PCT: 44 %
Neutro Abs: 2860 cells/uL (ref 1500–7800)
Platelets: 346 10*3/uL (ref 140–400)
RBC: 4.39 MIL/uL (ref 3.80–5.10)
RDW: 13.2 % (ref 11.0–15.0)
WBC: 6.5 10*3/uL (ref 3.8–10.8)

## 2015-11-17 MED ORDER — PHENTERMINE HCL 37.5 MG PO CAPS: 37.5000 mg | ORAL_CAPSULE | ORAL | 2 refills | Status: DC

## 2015-11-17 NOTE — Progress Notes (Signed)
Sheryl ClarksRocio Logan 15-Sep-1975 [id number]   History:    40 y.o.  for annual gyn exam with concerns still of increased weight. Last year she was weighing 181 pounds since she's up to 188 pounds today with a BMI 36.11 kg/m. Patient had a ParaGard T380A IUD placed in 2012 and reports normal menstrual cycles. Patient several years ago was treated for trichomoniasis as well as chlamydia infection. In 2015 she had a negative STD screen. Patient has had no change in sexual partners and was otherwise asymptomatic today. Patient with no previous history of any abnormal Pap smear. She is due for her mammogram. She requested flu vaccine today as well. Review of her records from last year it appears patient's lipid profile in 2015 2016 have indicated that her triglycerides were 170 472 respectively and past year her LDL was re-LDL was slightly elevated at 34.  Past medical history,surgical history, family history and social history were all reviewed and documented in the EPIC chart.  Gynecologic History Patient's last menstrual period was 11/03/2015. Contraception: IUD Last Pap: 2014. Results were: normal Last mammogram: 2016. Results were: normal  Obstetric History OB History  Gravida Para Term Preterm AB Living  6 4     1 4   SAB TAB Ectopic Multiple Live Births  1            # Outcome Date GA Lbr Len/2nd Weight Sex Delivery Anes PTL Lv  6 SAB           5 Para           4 Para           3 Para           2 Para           1 Gravida                ROS: A ROS was performed and pertinent positives and negatives are included in the history.  GENERAL: No fevers or chills. HEENT: No change in vision, no earache, sore throat or sinus congestion. NECK: No pain or stiffness. CARDIOVASCULAR: No chest pain or pressure. No palpitations. PULMONARY: No shortness of breath, cough or wheeze. GASTROINTESTINAL: No abdominal pain, nausea, vomiting or diarrhea, melena or bright red blood per rectum. GENITOURINARY: No  urinary frequency, urgency, hesitancy or dysuria. MUSCULOSKELETAL: No joint or muscle pain, no back pain, no recent trauma. DERMATOLOGIC: No rash, no itching, no lesions. ENDOCRINE: No polyuria, polydipsia, no heat or cold intolerance. No recent change in weight. HEMATOLOGICAL: No anemia or easy bruising or bleeding. NEUROLOGIC: No headache, seizures, numbness, tingling or weakness. PSYCHIATRIC: No depression, no loss of interest in normal activity or change in sleep pattern.     Exam: chaperone present  BP 120/82   Ht 5' 0.5" (1.537 m)   Wt 188 lb (85.3 kg)   LMP 11/03/2015   BMI 36.11 kg/m   Body mass index is 36.11 kg/m.  General appearance : Well developed well nourished female. No acute distress HEENT: Eyes: no retinal hemorrhage or exudates,  Neck supple, trachea midline, no carotid bruits, no thyroidmegaly Lungs: Clear to auscultation, no rhonchi or wheezes, or rib retractions  Heart: Regular rate and rhythm, no murmurs or gallops Breast:Examined in sitting and supine position were symmetrical in appearance, no palpable masses or tenderness,  no skin retraction, no nipple inversion, no nipple discharge, no skin discoloration, no axillary or supraclavicular lymphadenopathy Abdomen: no palpable masses or tenderness, no rebound or guarding  Extremities: no edema or skin discoloration or tenderness  Pelvic:  Bartholin, Urethra, Skene Glands: Within normal limits             Vagina: No gross lesions or discharge  Cervix: No gross lesions or discharge, IUD visualized  Uterus  anteverted, normal size, shape and consistency, non-tender and mobile  Adnexa  Without masses or tenderness  Anus and perineum  normal   Rectovaginal  normal sphincter tone without palpated masses or tenderness             Hemoccult not indicated     Assessment/Plan:  40 y.o. female for annual exam who continues to struggle with her weight. With with doing an extensive discussion as to her diet and her  exercise activity. Literature information was provided on both the subject. We discussed appetite suppressant medication such as phentermine 37.5 mg daily. We discussed potential risk of long-term usage of this medication to include cardiac valvular disease and pulmonary hypertension. Patient will like to proceed for 3 masslike continue with a planned diet and exercise program [place]. The following screening blood work was ordered today: CBC, comprehensive metabolic panel, fasting lipid profile, TSH, CBC, and urinalysis. Pap smear with HPV screening along with GC and Chlamydia culture was obtained as well. Patient did receive the flu vaccine today after signing consent form. She was provided with a requisition to schedule her overdue mammogram. Patient to return back in 6 weeks for follow-up   Ok EdwardsFERNANDEZ,JUAN H MD, 10:46 AM 11/17/2015

## 2015-11-17 NOTE — Patient Instructions (Addendum)
Control del colesterol  Los niveles de colesterol en el organismo estn determinados significativamente por su dieta. Los niveles de colesterol tambin se relacionan con la enfermedad cardaca. El material que sigue ayuda a explicar esta relacin y a analizar qu puede hacer para mantener su corazn sano. No todo el colesterol es malo. Las lipoprotenas de baja densidad (LDL) forman el colesterol "malo". El colesterol malo puede ocasionar depsitos de grasa que se acumulan en el interior de las arterias. Las lipoprotenas de alta densidad (HDL) es el colesterol "bueno". Ayuda a remover el colesterol LDL "malo" de la sangre. El colesterol es un factor de riesgo muy importante para la enfermedad cardaca. Otros factores de riesgo son la hipertensin arterial, el hbito de fumar, el estrs, la herencia y el peso.   El msculo cardaco obtiene el suministro de sangre a travs de las arterias coronarias. Si su colesterol LDL ("malo") est elevado y el HDL ("bueno") es bajo, tiene un factor de riesgo para que se formen depsitos de grasa en las arterias coronarias (los vasos sanguneos que suministran sangre al corazn). Esto hace que haya menos lugar para que la sangre circule. Sin la suficiente sangre y oxgeno, el msculo cardaco no puede funcionar correctamente, y usted podr sentir dolores en el pecho (angina pectoris). Cuando una arteria coronaria se cierra completamente, una parte del msculo cardaco puede morir (infarto de miocardio).  CONTROL DEL COLESTEROL Cuando el profesional que lo asiste enva la sangre al laboratorio para conocer el nivel de colesterol, puede realizarle tambin un perfil completo de los lpidos. Con esta prueba, se puede determinar la cantidad total de colesterol, as como los niveles de LDL y HDL. Los triglicridos son un tipo de grasa que circula en la sangre y que tambin puede utilizarse para determinar el riesgo de enfermedad  cardaca. En la siguiente tabla se establecen los nmeros ideales: Prueba: Colesterol total  Menos de 200 mg/dl.  Prueba: LDL "colesterol malo"  Menos de 100 mg/dl.   Menos de 70 mg/dl si tiene riesgo muy elevado de sufrir un ataque cardaco o muerte cardaca sbita.  Prueba: HDL "colesterol bueno"  Mujeres: Ms de 50 mg/dl.   Hombres: Ms de 40 mg/dl.  Prueba: Trigliceridos  Menos de 150 mg/dl.    CONTROL DEL COLESTEROL CON DIETA Aunque factores como el ejercicio y el estilo de vida son importantes, la "primera lnea de ataque" es la dieta. Esto se debe a que se sabe que ciertos alimentos hacen subir el colesterol y otros lo bajan. El objetivo debe ser equilibrar los alimentos, de modo que tengan un efecto sobre el colesterol y, an ms importante, reemplazar las grasas saturadas y trans con otros tipos de grasas, como las monoinsaturadas y las poliinsaturadas y cidos grasos omega-3 . En promedio, una persona no debe consumir ms de 15 a 17 g de grasas saturadas por da. Las grasas saturadas y trans se consideran grasas "malas", ya que elevan el colesterol LDL. Las grasas saturadas se encuentran principalmente en productos animales como carne, manteca y crema. Pero esto no significa que usted debe sacrificar todas sus comidas favoritas. Actualmente, como lo muestra el cuadro que figura al final de este documento, hay sustitutos de buen sabor, bajos en grasas y en colesterol, para la mayora de los alimentos que a usted le gusta comer. Elija aquellos alimentos alternativos que sean bajos en grasas o sin grasas. Elija cortes de carne del cuarto trasero o lomo ya que estos cortes son los que tienen menor cantidad de grasa   y colesterol. El pollo (sin piel), el pescado, la carne de ternera, y la pechuga de pavo molida son excelentes opciones. Elimine las carnes grasosas como los hotdogs o el salami. Los mariscos tienen poco o nada de grasas saturadas. Cuando consuma carne magra, carne de aves de  corral, o pescado, hgalo en porciones de 85 gramos (3 onzas). Las grasas trans tambin se llaman "aceites parcialmente hidrogenados". Son aceites manipulados cientficamente de modo que son slidos a temperatura ambiente, tienen una larga vida y mejoran el sabor y la textura de los alimentos a los que se agregan. Las grasas trans se encuentran en la margarina, masitas, crackers y alimentos horneados.  Para hornear y cocinar, el aceite es un excelente sustituto para la mantequilla. Los aceites monoinsaturados tienen un beneficio particular, ya que se cree que disminuyen el colesterol LDL (colesterol malo) y elevan el HDL. Deber evitar los aceites tropicales saturados como el de coco y el de palma.  Recuerde, adems, que puede comer sin restricciones los grupos de alimentos que son naturalmente libres de grasas saturadas y grasas trans, entre los que se incluyen el pescado, las frutas (excepto el aguacate), verduras, frijoles, cereales (cebada, arroz, cuzcuz, trigo) y las pastas (sin salsas con crema)   IDENTIFIQUE LOS ALIMENTOS QUE DISMINUYEN EL COLESTEROL  Pueden disminuir el colesterol las fibras solubles que estn en las frutas, como las manzanas, en los vegetales como el brcoli, las patatas y las zanahorias; en las legumbres como frijoles, guisantes y lentejas; y en los cereales como la cebada. Los alimentos fortificados con fitosteroles tambin pueden disminuir el colesterol. Debe consumir al menos 2 g de estos alimentos a diario para obtener el efecto de disminucin de colesterol.  En el supermercado, lea las etiquetas de los envases para identificar los alimentos bajos en grasas saturadas, libres de grasas trans y bajos en grasas, . Elija quesos que tengan solo de 2 a 3 g de grasa saturada por onza (28,35 g). Use una margarina que no dae el corazn, libre de grasas trans o aceite parcialmente hidrogenado. Al comprar alimentos horneados (galletitas dulces y galletas) evite el aceite parcialmente  hidrogenado. Los panes y bollos debern ser de granos enteros (harina de maz o de avena entera, en lugar de "harina" o "harina enriquecida"). Compre sopas en lata que no sean cremosas, con bajo contenido de sal y sin grasas adicionadas.   TCNICAS DE PREPARACIN DE LOS ALIMENTOS  Nunca fra los alimentos en aceite abundante. Si debe frer, hgalo en poco aceite y removiendo constantemente, porque as se utilizan muy pocas grasas, o utilice un spray antiadherente. Cuando le sea posible, hierva, hornee o ase las carnes y cocine los vegetales al vapor. En vez de aderezar los vegetales con mantequilla o margarina, utilice limn y hierbas, pur de manzanas y canela (para las calabazas y batatas), yogurt y salsa descremados y aderezos para ensaladas bajos en contenido graso.   BAJO EN GRASAS SATURADAS / SUSTITUTOS BAJOS EN GRASA  Carnes / Grasas saturadas (g)  Evite: Bife, corte graso (3 oz/85 g) / 11 g   Elija: Bife, corte magro (3 oz/85 g) / 4 g   Evite: Hamburguesa (3 oz/85 g) / 7 g   Elija:  [place]amb[place]uesa magra (3 oz/85 g) / 5 g   Evite: Jamn (3 oz/85 g) / 6 g   Elija:  Jamn magro (3 oz/85 g) / 2.4 g   Evite: Pollo, con piel (3 oz/85 g), Carne oscura / 4 g   Elija:  Pollo, sin piel (  3 oz/85 g), Carne oscura / 2 g   Evite: Pollo, con piel (3 oz/85 g), Carne magra / 2.5 g   Elija: Pollo, sin piel (3 oz/85 g), Carne magra / 1 g  Lcteos / Grasas saturadas (g)  Evite: Leche entera (1 taza) / 5 g   Elija: Leche con bajo contenido de grasa, 2% (1 taza) / 3 g   Elija: Leche con bajo contenido de grasa, 1% (1 taza) / 1.5 g   Elija: Leche descremada (1 taza) / 0.3 g   Evite: Queso duro (1 oz/28 g) / 6 g   Elija: Queso descremado (1 oz/28 g) / 2-3 g   Evite: Queso cottage, 4% grasa (1 taza)/ 6.5 g   Elija: Queso cottage con bajo contenido de grasa, 1% grasa (1 taza)/ 1.5 g   Evite: Helado (1 taza) / 9 g   Elija: Sorbete (1 taza) / 2.5 g   Elija: Yogurt helado sin contenido de  grasa (1 taza) / 0.3 g   Elija: Barras de fruta congeladas / vestigios   Evite: Crema batida (1 cucharada) / 3.5 g   Elija: Batidos glac sin lcteos (1 cucharada) / 1 g  Condimentos / Grasas saturadas (g)  Evite: Mayonesa (1 cucharada) / 2 g   Elija: Mayonesa con bajo contenido de grasa (1 cucharada) / 1 g   Evite: Manteca (1 cucharada) / 7 g   Elija: Margarina extra light (1 cucharada) / 1 g   Evite: Aceite de coco (1 cucharada) / 11.8 g   Elija: Aceite de oliva (1 cucharada) / 1.8 g   Elija: Aceite de maz (1 cucharada) / 1.7 g   Elija: Aceite de crtamo (1 cucharada) / 1.2 g   Elija: Aceite de girasol (1 cucharada) / 1.4 g   Elija: Aceite de soja (1 cucharada) / 2.4 g   Elija: Aceite de canola (1 cucharada) / 1 g  Document Released: 01/02/2005 Document Revised: 09/14/2010 ExitCare Patient Information 2012 [employer]Care[employer]. Ejercicios para perder peso (Exercise to Lose Weight) La actividad fsica y una dieta saludable ayudan a perder peso. El mdico podr sugerirle ejercicios especficos. IDEAS Y CONSEJOS PARA HACER EJERCICIOS  Elija opciones econmicas que disfrute hacer , como caminar, andar en bicicleta o los vdeos para ejercitarse.   Utilice las escaleras en lugar del ascensor.   Camine durante la hora del almuerzo.   Estacione el auto lejos del lugar de trabajo o estudio.   Concurra a un gimnasio o tome clases de gimnasia.   Comience con 5  10 minutos de actividad fsica por da. Ejercite hasta 30 minutos, 4 a 6 das por semana.   Utilice zapatos que tengan un buen soporte y ropas cmodas.   Elongue antes y despus de ejercitar.   Ejercite hasta que aumente la respiracin y el corazn palpite rpido.   Beba agua extra cuando ejercite.   No haga ejercicio hasta lastimarse, sentirse mareado o que le falte mucho el aire.  La actividad fsica puede quemar alrededor de 150 caloras.  Correr 20 cuadras en 15 minutos.   Jugar vley durante 45 a 60  minutos.   Limpiar y encerar el auto durante 45 a 60 minutos.   Jugar ftbol americano de toque.   Caminar 25 cuadras en 35 minutos.   Empujar un cochecito 20 cuadras en 30 minutos.   Jugar baloncesto durante 30 minutos.   Rastrillar hojas secas durante 30 minutos.   Andar en bicicleta 80 cuadras en 30 minutos.     Caminar 30 cuadras en 30 minutos.   Bailar durante 30 minutos.   Quitar la nieve con una pala durante 15 minutos.   Nadar vigorosamente durante 20 minutos.   Subir escaleras durante 15 minutos.   Andar en bicicleta 60 cuadras durante 15 minutos.   Arreglar el jardn entre 30 y 45 minutos.   Saltar a la soga durante 15 minutos.   Limpiar vidrios o pisos durante 45 a 60 minutos.  Document Released: 04/08/2010 Document Revised: 09/14/2010 [facility] Patient Information 2012 River RidgeExitCare, MarylandLLC.Influenza Virus Vaccine (Flucelvax) Qu es este medicamento? La VACUNA ANTIGRIPAL ayuda a disminuir el riesgo de contraer la influenza, tambin conocida como la gripe. La vacuna solo ayuda a protegerle contra algunas cepas de influenza. Este medicamento puede ser utilizado para otros usos; si tiene alguna pregunta consulte con su proveedor de atencin mdica o con su farmacutico. Qu le debo informar a mi profesional de la salud antes de tomar este medicamento? Necesita saber si usted presenta alguno de los siguientes problemas o situaciones: -trastorno de sangrado como hemofilia -fiebre o infeccin -sndrome de Guillain-Barre u otros problemas neurolgicos -problemas del sistema inmunolgico -infeccin por el virus de la inmunodeficiencia humana (VIH) o SIDA -niveles bajos de plaquetas en la sangre -esclerosis mltiple -una reaccin alrgica o inusual a las vacunas antigripales, a otros medicamentos, alimentos, colorantes o conservantes -si est embarazada o buscando quedar embarazada -si est amamantando a un beb Cmo debo utilizar este medicamento? Esta vacuna se  administra mediante inyeccin por va intramuscular. Lo administra un profesional de Beazer Homesla salud. Recibir una copia de informacin escrita sobre la vacuna antes de cada vacuna. Asegrese de leer este folleto cada vez cuidadosamente. Este folleto puede cambiar con frecuencia. Hable con su pediatra para informarse acerca del uso de este medicamento en nios. Puede requerir atencin especial. Sobredosis: Pngase en contacto inmediatamente con un centro toxicolgico o una sala de urgencia si usted cree que haya tomado demasiado medicamento. ATENCIN: Reynolds AmericanEste medicamento es solo para usted. No comparta este medicamento con nadie. Qu sucede si me olvido de una dosis? No se aplica en este caso. Qu puede interactuar con este medicamento? -quimioterapia o radioterapia -medicamentos que suprimen el sistema inmunolgico, tales como etanercept, anakinra, infliximab y adalimumab -medicamentos que tratan o previenen cogulos sanguneos, como warfarina -fenitona -medicamentos esteroideos, como la prednisona o la cortisona -teofilina -vacunas Puede ser que esta lista no menciona todas las posibles interacciones. Informe a su profesional de Beazer Homesla salud de In[employer] Micro Inctodos los productos a base de hierbas, medicamentos de [place] libre o suplementos nutritivos que est tomando. Si usted fuma, consume bebidas alcohlicas o si utiliza drogas ilegales, indqueselo tambin a su profesional de Beazer Homesla salud. Algunas sustancias pueden interactuar con su medicamento. A qu debo estar atento al usar PPL Corporationeste medicamento? Informe a su mdico o a Producer, television/film/videosu profesional de la Dollar Generalsalud sobre todos los efectos secundarios que persistan despus de [address] das. Llame a su proveedor de atencin mdica si se presentan sntomas inusuales dentro de las 6 semanas de recibir esta vacuna. Es posible que todava pueda contraer la gripe, pero la enfermedad no ser tan fuerte como normalmente. No puede contraer la gripe de esta vacuna. La vacuna antigripal no le protege contra  resfros u otras enfermedades que pueden causar Crawfordfiebre. Debe vacunarse cada ao. Qu efectos secundarios puedo tener al Boston Scientificutilizar este medicamento? Efectos secundarios que debe informar a su mdico o a Producer, television/film/videosu profesional de la salud tan pronto como sea posible: -reacciones alrgicas como erupcin cutnea, picazn o urticarias, hinchazn de la  cara, labios o lengua Efectos secundarios que, por lo general, no requieren atencin mdica (debe informarlos a su mdico o a su profesional de la salud si persisten o si son molestos):  -fiebre -dolor de cabeza -molestias y dolores musculares -dolor, sensibilidad, enrojecimiento o Paramedic de la inyeccin -cansancio Puede ser que esta lista no menciona todos los posibles efectos secundarios. Comunquese a su mdico por asesoramiento mdico [employer]lett[employer]ard. Usted puede informar los efectos secundarios a la FDA por telfono al 1-800-FDA-1088. Dnde debo guardar mi medicina? Esta vacuna se administrar por un profesional de la salud en una [place], Corporate investment banker, consultorio mdico u otro consultorio de un profesional de la salud. No se le suministrar esta vacuna para guardar en su domicilio. ATENCIN: Este folleto es un resumen. Puede ser que no cubra toda la posible informacin. Si usted tiene preguntas acerca de esta medicina, consulte con su mdico, su farmacutico o su profesional de Radiographer, therapeutic.    2016, Elsevier/Gold Standard. (2010-12-19 16:29:16) Phentermine tablets or capsules Qu es este medicamento? La FENTERMINA reduce su apetito. Si la combina con una dieta de bajas caloras y ejercio puede ayudarle a Publishing copy de [place]. Este medicamento puede ser utilizado para otros usos; si tiene alguna pregunta consulte con su proveedor de atencin mdica o con su farmacutico. Qu le debo informar a mi profesional de la salud antes de tomar este medicamento? Necesita saber si usted presenta alguno de los siguientes problemas o  situaciones: -agitacin -glaucoma -enfermedad cardiaca -alta presin sangunea -antecedentes de abuso de substancias -enfermedad pulmonar llamada Hipertensin Pulmonar Primaria -si ha tomado un IMAO, tales como Carbex, Eldepryl, Marplan, Nardil o Parnate en los ltimos 14 das -enfermedad tiroidea -una reaccin alrgica o inusual a la fentermina, a otros medicamentos, alimentos, colorantes o conservantes -si est embarazada o buscando quedar embarazada -si est amamantando a un beb Cmo debo utilizar este medicamento? Tome este medicamento por va oral con un vaso de agua. Siga las instrucciones de la etiqueta del [place]. Este medicamento generalmente se toma 30 minutos antes o 1 a 2 horas despus de desayunar. Evite tomar este medicamento por la tarde. Puede interferir con el sueo. Tome sus dosis a intervalos regulares. No tome su medicamento con una frecuencia mayor a la indicada. Hable con su pediatra para informarse acerca del uso de este medicamento en nios. Puede requerir atencin especial. Sobredosis: Pngase en contacto inmediatamente con un centro toxicolgico o una sala de urgencia si usted cree que haya tomado demasiado medicamento. ATENCIN: Reynolds American es solo para usted. No comparta este medicamento con nadie. Qu sucede si me olvido de una dosis? Si olvida una dosis, tmela lo antes posible. Si es casi la hora de la prxima dosis, tome slo esa dosis. No tome dosis adicionales o dobles. Qu puede interactuar con este medicamento? No tome esta medicina con ninguno de los siguientes medicamentos: -duloxetina -IMAOs, tales como Carbex, Eldepryl, Marplan, Nardil y Parnate -medicamentos para resfros o problemas respiratorios, tales como seudoefedrina o fenilefrina -procarbazina -sibutramine -ISRS, tales como citalopram, escitalopram, fluoxetina, fluvoxamina, paroxetina y sertralina -estimulantes, tales como dexmetilfenidato, metilfenidato o  modafinil -venlafaxina Esta medicina tambin puede interactuar con los siguientes medicamentos: -medicamentos para la diabetes Puede ser que esta lista no menciona todas las posibles interacciones. Informe a su profesional de Beazer Homes de [employer] Inc productos a base de hierbas, medicamentos de [place] o suplementos nutritivos que est tomando. Si usted fuma, consume bebidas alcohlicas o si utiliza drogas ilegales, indqueselo tambin a su profesional  de Beazer Homesla salud. Algunas sustancias pueden interactuar con su medicamento. A qu debo estar atento al usar PPL Corporationeste medicamento? Informe a su mdico de inmediato si siente que le falta el aliento mientras realiza sus actividades habituales. No tome este medicamento dentro de 6 horas de la hora de Lisbonacostarse. Puede impedirle dormir. Evite las bebidas que contienen cafena y trata de Pharmacologistmantener un horario de acostarse habitual cada noche. Este medicamento fue diseado para ser utilizado en combinacin con una dieta saludable y ejercicio. De esta manera se obtienen los Safeco Corporationmejores resultados. Este medicamento slo est indicado para uso a Product managercorto plazo. Con el tiempo, su peso puede estabilizarse. A partir de Winn-Dixieese momento, el medicamento slo le ayudar a Pharmacologistmantener su Pathmark Storesnuevo peso. No aumente ni modifique su dosis de ninguna manera sin consultar a su mdico. Puede experimentar mareos o somnolencia. No conduzca ni utilice maquinaria ni haga nada que Scientist, research (life sciences)le exija permanecer en estado de alerta hasta que sepa cmo le afecta este medicamento. No se siente ni se ponga de pie con rapidez, especialmente si es un paciente de edad avanzada. Esto reduce el riesgo de mareos o Newell Rubbermaiddesmayos. El alcohol puede aumentar los mareos y la somnolencia. Evite consumir bebidas alcohlicas. Qu efectos secundarios puedo tener al Boston Scientificutilizar este medicamento? Efectos secundarios que debe informar a su mdico o a Producer, television/film/videosu profesional de la salud tan pronto como sea posible: -Journalist, newspaperdolor en el pecho, palpitaciones -depresin  o cambios severos de estados de nimo -aumento de la presin sangunea -irritabilidad -nerviosismo o inquietud -mareos severos -falta de aliento -problemas para orinar -hinchazn inusual de las piernas -vmito Efectos secundarios que, por lo general, no requieren Psychologist, prison and probation servicesatencin mdica (debe informarlos a su mdico o a su profesional de la salud si persisten o si son molestos): -visin borrosa u otros problemas de ojo -cambios en la capacidad o el deseo sexual -estreimiento o diarrea -dificultad para conciliar el sueo -boca seca o sabor desagradable -dolor de cabeza -nuseas Puede ser que esta lista no menciona todos los posibles efectos secundarios. Comunquese a su mdico por asesoramiento mdico Hewlett-Packardsobre los efectos secundarios. Usted puede informar los efectos secundarios a la FDA por telfono al 1-800-FDA-1088. Dnde debo guardar mi medicina? Mantngala fuera del alcance de los nios. Este medicamento puede ser abusado. Mantenga su medicamento en un lugar seguro para protegerlo contra robos. No comparta este medicamento con nadie. Es peligroso vender o Restaurant manager, fast foodregalar este medicamento y est prohibido por la ley. Gurdela a Sanmina-SCItemperatura ambiente, entre 20 y 25 grados C ([phone] y [phone] grados F). Mantenga el envase bien cerrado. Deseche todo el medicamento que no haya utilizado, despus de la fecha de vencimiento. ATENCIN: Este folleto es un resumen. Puede ser que no cubra toda la posible informacin. Si usted tiene preguntas acerca de esta medicina, consulte con su mdico, su farmacutico o su profesional de Radiographer, therapeuticla salud.    2016, Elsevier/Gold Standard. (2014-02-24 00:00:00)

## 2015-11-18 ENCOUNTER — Other Ambulatory Visit: Payer: Self-pay | Admitting: Anesthesiology

## 2015-11-18 DIAGNOSIS — E063 Autoimmune thyroiditis: Secondary | ICD-10-CM

## 2015-11-18 DIAGNOSIS — E038 Other specified hypothyroidism: Secondary | ICD-10-CM

## 2015-11-18 DIAGNOSIS — R748 Abnormal levels of other serum enzymes: Secondary | ICD-10-CM

## 2015-11-18 LAB — URINALYSIS W MICROSCOPIC + REFLEX CULTURE
BILIRUBIN URINE: NEGATIVE
CASTS: NONE SEEN [LPF]
CRYSTALS: NONE SEEN [HPF]
Glucose, UA: NEGATIVE
Ketones, ur: NEGATIVE
Nitrite: NEGATIVE
PROTEIN: NEGATIVE
Specific Gravity, Urine: 1.019 (ref 1.001–1.035)
YEAST: NONE SEEN [HPF]
pH: 6 (ref 5.0–8.0)

## 2015-11-18 LAB — COMPREHENSIVE METABOLIC PANEL
ALT: 54 U/L — ABNORMAL HIGH (ref 6–29)
AST: 47 U/L — AB (ref 10–30)
Albumin: 3.9 g/dL (ref 3.6–5.1)
Alkaline Phosphatase: 66 U/L (ref 33–115)
BILIRUBIN TOTAL: 0.2 mg/dL (ref 0.2–1.2)
BUN: 10 mg/dL (ref 7–25)
CALCIUM: 8.9 mg/dL (ref 8.6–10.2)
CO2: 21 mmol/L (ref 20–31)
CREATININE: 0.66 mg/dL (ref 0.50–1.10)
Chloride: 105 mmol/L (ref 98–110)
GLUCOSE: 92 mg/dL (ref 65–99)
Potassium: 3.6 mmol/L (ref 3.5–5.3)
SODIUM: 139 mmol/L (ref 135–146)
Total Protein: 7.6 g/dL (ref 6.1–8.1)

## 2015-11-18 LAB — THYROID PANEL WITH TSH
Free Thyroxine Index: 2 (ref 1.4–3.8)
T3 Uptake: 31 % (ref 22–35)
T4 TOTAL: 6.5 ug/dL (ref 4.5–12.0)
TSH: 4.76 m[IU]/L — AB

## 2015-11-18 LAB — LIPID PANEL
Cholesterol: 165 mg/dL (ref 125–200)
HDL: 38 mg/dL — AB (ref >=46)
LDL CALC: 101 mg/dL (ref <130)
Total CHOL/HDL Ratio: 4.3 Ratio (ref <=5.0)
Triglycerides: 131 mg/dL (ref <150)
VLDL: 26 mg/dL (ref <30)

## 2015-11-19 LAB — URINE CULTURE: Organism ID, Bacteria: NO GROWTH

## 2015-11-22 LAB — PAP IG, CT-NG NAA, HPV HIGH-RISK
Chlamydia Probe Amp: NOT DETECTED
GC Probe Amp: NOT DETECTED
HPV DNA High Risk: NOT DETECTED

## 2015-12-02 ENCOUNTER — Other Ambulatory Visit: Payer: Commercial Managed Care - PPO

## 2015-12-02 ENCOUNTER — Other Ambulatory Visit: Payer: Self-pay | Admitting: Gynecology

## 2015-12-02 DIAGNOSIS — R748 Abnormal levels of other serum enzymes: Secondary | ICD-10-CM

## 2015-12-02 DIAGNOSIS — E063 Autoimmune thyroiditis: Secondary | ICD-10-CM

## 2015-12-02 DIAGNOSIS — E038 Other specified hypothyroidism: Secondary | ICD-10-CM

## 2015-12-02 LAB — ALT: ALT: 47 U/L — ABNORMAL HIGH (ref 6–29)

## 2015-12-02 LAB — THYROID PANEL WITH TSH
Free Thyroxine Index: 2 (ref 1.4–3.8)
T3 Uptake: 31 % (ref 22–35)
T4 TOTAL: 6.5 ug/dL (ref 4.5–12.0)
TSH: 7.31 m[IU]/L — AB

## 2015-12-02 LAB — AST: AST: 41 U/L — ABNORMAL HIGH (ref 10–30)

## 2015-12-03 ENCOUNTER — Telehealth: Payer: Self-pay | Admitting: *Deleted

## 2015-12-03 DIAGNOSIS — E063 Autoimmune thyroiditis: Secondary | ICD-10-CM

## 2015-12-03 NOTE — Telephone Encounter (Signed)
Referral placed at [facility] endocrinologist they will contact pt to schedule.

## 2015-12-03 NOTE — Telephone Encounter (Signed)
-----   Message from Keenan BachelorKatherine R Annas, ArizonaRMA sent at 12/03/2015  2:22 PM EST ----- Regarding: referral to endocrinology Per Dr. Glenetta HewJF " I would like to refer also to the endocrinologist for her what appears to be Hashimoto's thyroiditis (subacute hypothyroidism) for further evaluation."  Sheryl FritzBlanca will call patient with appt once you get time/date. Thanks

## 2015-12-04 LAB — HEPATITIS PANEL, ACUTE
HCV Ab: NEGATIVE
HEP B C IGM: NONREACTIVE
Hep A IgM: NONREACTIVE
Hepatitis B Surface Ag: NEGATIVE

## 2015-12-06 NOTE — Telephone Encounter (Signed)
Appointment on 12/29/15 @ 2:00pm with Dr.Kumar blanca will relay

## 2015-12-13 NOTE — Telephone Encounter (Signed)
blanca informed pt

## 2015-12-14 ENCOUNTER — Other Ambulatory Visit (INDEPENDENT_AMBULATORY_CARE_PROVIDER_SITE_OTHER): Payer: Commercial Managed Care - PPO

## 2015-12-14 ENCOUNTER — Other Ambulatory Visit: Payer: Self-pay | Admitting: Gastroenterology

## 2015-12-14 ENCOUNTER — Ambulatory Visit (INDEPENDENT_AMBULATORY_CARE_PROVIDER_SITE_OTHER): Payer: Commercial Managed Care - PPO | Admitting: Physician Assistant

## 2015-12-14 ENCOUNTER — Encounter: Payer: Self-pay | Admitting: Physician Assistant

## 2015-12-14 VITALS — BP 110/82 | HR 70 | Ht 61.0 in | Wt 186.0 lb

## 2015-12-14 DIAGNOSIS — R7989 Other specified abnormal findings of blood chemistry: Secondary | ICD-10-CM

## 2015-12-14 DIAGNOSIS — R945 Abnormal results of liver function studies: Secondary | ICD-10-CM

## 2015-12-14 LAB — FERRITIN: FERRITIN: 39.8 ng/mL (ref 10.0–291.0)

## 2015-12-14 LAB — SEDIMENTATION RATE: SED RATE: 16 mm/h (ref 0–20)

## 2015-12-14 NOTE — Progress Notes (Signed)
Agree with assessment and plan. Agree that workup will most likely reveal fatty liver, will await results.

## 2015-12-14 NOTE — Progress Notes (Signed)
Subjective:    Patient ID: Sheryl Logan, female    DOB: 10/12/1975, 40 y.o.   MRN: [id number]  HPI Sheryl Logan is a pleasant 40 year old Hispanic female, new to GI today referred by Dr. Lily PeerFernandez for abnormal liver function studies. Patient is generally in good health, on no regular medications. She has just been diagnosed with hypothyroidism. She is also obese with a BMI of 35. She has no known history of previously abnormal liver tests no known history of liver disease. She had recent labs done in November 2017 which showed an AST of 41 ALT of 47. Labs were reviewed and liver tests from November 2016 were normal. Hepatitis A, B, and C serologies are negative. Patient has no GI complaints today. She says she has gained about 30 pounds in the past year or so which has been distressing her. Her appetite has been fine. No problems with her bowels though tends towards constipation no melena or hematochezia. She does not drink alcohol on any sort of regular basis. No family history of liver disease that she is aware of.   She does mention a left lower abdominal discomfort which she notices at times with bending. She says she does do a lot of lifting at work.  Review of Systems Pertinent positive and negative review of systems were noted in the above HPI section.  All other review of systems was otherwise negative.  Outpatient Encounter Prescriptions as of 12/14/2015  Medication Sig  . [DISCONTINUED] ibuprofen (ADVIL,MOTRIN) 800 MG tablet Take 1 tablet (800 mg total) by mouth 3 (three) times daily. (Patient not taking: Reported on 11/17/2015)  . [DISCONTINUED] phentermine 37.5 MG capsule Take 1 capsule (37.5 mg total) by mouth every morning.   No facility-administered encounter medications on file as of 12/14/2015.    No Known Allergies Patient Active Problem List   Diagnosis Date Noted  . Mixed hyperlipidemia 11/17/2015  . Mass of arm 10/15/2012   Social History   Social History  . Marital  status: Married    Spouse name: N/A  . Number of children: N/A  . Years of education: N/A   Occupational History  . Not on file.   Social History Main Topics  . Smoking status: Never Smoker  . Smokeless tobacco: Never Used  . Alcohol use No  . Drug use: Unknown  . Sexual activity: Yes    Birth control/ protection: IUD     Comment: PARAGARD   Other Topics Concern  . Not on file   Social History Narrative  . No narrative on file    Ms. Klepacki's family history includes Hyperlipidemia in her mother; Hypertension in her mother.      Objective:    Vitals:   12/14/15 0834  BP: 110/82  Pulse: 70    Physical Exam  well-developed Hispanic female in no acute distress, accompanied by an interpreter, pleasant blood pressure 110/82 pulse 70, Height 5 foot 1, weight 186, BMI 35.1. HEENT ;nontraumatic normocephalic EOMI PERRLA, sclera anicteric, Cardiovascular ;regular rate and rhythm with S1-S2 no murmur rub or gallop, Pulmonary; clear bilaterally, Abdomen ;soft, nontender nondistended bowel sounds are active there is no palpable mass or hepatosplenomegaly,, Rectal; exam not done, Ext; no clubbing cyanosis or edema skin warm and dry, Neuropsych; mood and affect appropriate       Assessment & Plan:   #531 40 year old Hispanic female with mild transaminitis, chronicity is not well established with normal LFTs 1 year ago. Most likely this is related to fatty liver disease,  will rule out other chronic liver disease, i.e. Autoimmune  #2 obesity #3 hypothyroidism  Plan; Will schedule upper abdominal ultrasound Check ANA, AMA, ceruloplasmin, ferritin, alpha-1 antitrypsin level, anti-smooth muscle antibody. Brief discussion today regarding fatty liver disease. Recent lipid panel was unremarkable. Patient is advised to start a weight loss program with low-fat lower carb diet and moderate exercise. Will need office follow-up, and repeat LFTs in 3 months. Patient will be established with  Dr. Adela LankArmbruster.  Amy S Esterwood PA-C 12/14/2015   Cc: No ref. provider found

## 2015-12-14 NOTE — Patient Instructions (Addendum)
Please go to the basement level to have your labs drawn.   You have been scheduled for an abdominal ultrasound at [facility] (1st floor of hospital) on Friday 12-17-2015 at 7:30 am. Please arrive at 7:15 am   to your appointment for registration. Make certain not to have anything to eat or drink after midnight.  Should you need to reschedule your appointment, please contact radiology at [phone]. This test typically takes about 30 minutes to perform.  We will arrange for you to have a follow up appointment with Amy or Dr. Adela LankArmbruster.

## 2015-12-15 LAB — ANA: Anti Nuclear Antibody(ANA): NEGATIVE

## 2015-12-16 LAB — ANTI-SMOOTH MUSCLE ANTIBODY, IGG: Smooth Muscle Ab: <20 U (ref <20)

## 2015-12-16 LAB — CERULOPLASMIN: Ceruloplasmin: 27 mg/dL (ref 18–53)

## 2015-12-16 LAB — MITOCHONDRIAL ANTIBODIES: Mitochondrial M2 Ab, IgG: <=20 Units (ref <=20.0)

## 2015-12-17 ENCOUNTER — Ambulatory Visit
Admission: RE | Admit: 2015-12-17 | Discharge: 2015-12-17 | Disposition: A | Discharge status: Home / Self Care | Payer: Commercial Managed Care - PPO | Source: Ambulatory Visit | Attending: Gynecology | Admitting: Gynecology

## 2015-12-17 ENCOUNTER — Ambulatory Visit (HOSPITAL_COMMUNITY)
Admission: RE | Admit: 2015-12-17 | Discharge: 2015-12-17 | Disposition: A | Discharge status: Home / Self Care | Payer: Commercial Managed Care - PPO | Source: Ambulatory Visit | Attending: Physician Assistant | Admitting: Physician Assistant

## 2015-12-17 DIAGNOSIS — Z1231 Encounter for screening mammogram for malignant neoplasm of breast: Secondary | ICD-10-CM

## 2015-12-17 DIAGNOSIS — R7989 Other specified abnormal findings of blood chemistry: Secondary | ICD-10-CM | POA: Diagnosis present

## 2015-12-17 DIAGNOSIS — R945 Abnormal results of liver function studies: Secondary | ICD-10-CM

## 2015-12-29 ENCOUNTER — Ambulatory Visit: Payer: Commercial Managed Care - PPO | Admitting: Gynecology

## 2015-12-29 ENCOUNTER — Ambulatory Visit (INDEPENDENT_AMBULATORY_CARE_PROVIDER_SITE_OTHER): Payer: Commercial Managed Care - PPO | Admitting: Endocrinology

## 2015-12-29 ENCOUNTER — Encounter: Payer: Self-pay | Admitting: Endocrinology

## 2015-12-29 VITALS — BP 118/80 | HR 82 | Ht 61.0 in | Wt 182.0 lb

## 2015-12-29 DIAGNOSIS — E063 Autoimmune thyroiditis: Secondary | ICD-10-CM | POA: Insufficient documentation

## 2015-12-29 DIAGNOSIS — E038 Other specified hypothyroidism: Secondary | ICD-10-CM | POA: Diagnosis not present

## 2015-12-29 MED ORDER — LEVOTHYROXINE SODIUM 25 MCG PO TABS: 25.0000 ug | ORAL_TABLET | Freq: Every day | ORAL | 3 refills | Status: DC

## 2015-12-29 NOTE — Progress Notes (Signed)
Patient ID: Sheryl ClarksRocio Logan, female   DOB: 10-15-1975, 40 y.o.   MRN: [id number]             Reason for Appointment:  Hypothyroidism, new visit  Referring physician: Dr. Lily PeerFernandez   History of Present Illness:   Hypothyroidism was first diagnosed in 11/17  At the time of diagnosis patient was having symptoms of  fatigue for about a year She also thinks she has had gradual weight gain over the last few years off about 30 pounds. She has had some chronic cold sensitivity but no difficulty concentrating, dry skin.  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 Chlamydia infection 05/05/09  . Trichomonas infection     Past Surgical History:  Procedure Laterality Date  . INTRAUTERINE DEVICE (IUD) INSERTION     Paragard 2012    Family History  Problem Relation Age of Onset  . Hypertension Mother   . Hyperlipidemia Mother     Social History:  reports that she has never smoked. She has never used smokeless tobacco. She reports that she does not drink alcohol. Her drug history is not on file.  Allergies: No Known Allergies    Medication List       Accurate as of 12/29/15  2:47 PM. Always use your most recent med list.          levothyroxine 25 MCG tablet Commonly known as:  SYNTHROID Take 1 tablet (25 mcg total) by mouth daily before breakfast.          Review of Systems  Constitutional: Positive for  weight gain.  HENT: Negative for hoarseness.        She feels a little dryness in her throat and discomfort sometimes on swallowing  Respiratory: Positive for daytime sleepiness.        She works night shifts and has difficulty adjusting with her sleep  Cardiovascular: Negative for leg swelling.  Gastrointestinal: Negative for constipation.  Endocrine: Positive for fatigue. Negative for menstrual changes.  Genitourinary: Negative for frequency.  Musculoskeletal: Negative for joint pain.  Skin: Negative for rash.                Examination:    BP 118/80   Pulse 82   Ht 5\' 1"  (1.549 m)   Wt 182 lb (82.6 kg)   SpO2 97%   BMI 34.39 kg/m   GENERAL:  Average build.  Has mild generalized obesity.  No cushingoid features  No pallor, clubbing, lymphadenopathy or edema.   Skin:  no rash or pigmentation.  Mild facial hirsutism present  EYES:  No prominence of the eyes or swelling of the eyelids  ENT: Oral mucosa and tongue normal.  THYROID:  Not palpable.  HEART/LUNGS: Exam not indicated  ABDOMEN:  Exam not indicated  NEUROLOGICAL: Reflexes are bilaterally normal at biceps, difficult to elicit at ankles.  JOINTS:  Normal.   Assessment:  HYPOTHYROIDISM, mild with nonspecific symptoms of fatigue and weight gain Not clear if all of her symptoms are related to hypothyroidism She does not have a goiter on exam  However her TSH is confirmed high on 2 occasions and was quite normal in 2016  PLAN:   Trial of levothyroxine 25 g daily. Discussed with the patient the usual autoimmune cause for hypothyroidism and natural history Discussed that levothyroxine would be similar to natural thyroid hormone and would be used as a supplement to normalize her thyroid levels She may need progressive increase in her thyroid dose over the next few years  Follow-up in 2 months with labs  Consultation note sent to referring physician  [facility]UMAR,Zaul Hubers 12/29/2015, 2:47 PM

## 2016-02-15 ENCOUNTER — Encounter: Payer: Self-pay | Admitting: Gastroenterology

## 2016-02-15 ENCOUNTER — Other Ambulatory Visit (INDEPENDENT_AMBULATORY_CARE_PROVIDER_SITE_OTHER): Payer: Commercial Managed Care - PPO

## 2016-02-15 ENCOUNTER — Ambulatory Visit (INDEPENDENT_AMBULATORY_CARE_PROVIDER_SITE_OTHER): Payer: Commercial Managed Care - PPO | Admitting: Gastroenterology

## 2016-02-15 VITALS — BP 104/80 | HR 60 | Ht 60.25 in | Wt 184.1 lb

## 2016-02-15 DIAGNOSIS — E669 Obesity, unspecified: Secondary | ICD-10-CM

## 2016-02-15 DIAGNOSIS — K760 Fatty (change of) liver, not elsewhere classified: Secondary | ICD-10-CM

## 2016-02-15 DIAGNOSIS — Z6835 Body mass index (BMI) 35.0-35.9, adult: Secondary | ICD-10-CM

## 2016-02-15 DIAGNOSIS — E66812 Obesity, class 2: Secondary | ICD-10-CM

## 2016-02-15 LAB — HEMOGLOBIN A1C: Hgb A1c MFr Bld: 5.5 % (ref 4.6–6.5)

## 2016-02-15 LAB — HEPATITIS A ANTIBODY, TOTAL: Hep A Total Ab: REACTIVE — AB

## 2016-02-15 LAB — HEPATITIS B SURFACE ANTIBODY,QUALITATIVE: Hep B S Ab: NEGATIVE

## 2016-02-15 NOTE — Patient Instructions (Signed)
If you are age 42 or older, your body mass index should be between 23-30. Your Body mass index is 35.66 kg/m. If this is out of the aforementioned range listed, please consider follow up with your Primary Care Provider.  If you are age 42 or younger, your body mass index should be between 19-25. Your Body mass index is 35.66 kg/m. If this is out of the aformentioned range listed, please consider follow up with your Primary Care Provider.   Your physician has requested that you go to the basement for lab work before leaving today.  You have been referred to a Nutritionist. They will contact you with an appointment.  Thank you.

## 2016-02-15 NOTE — Progress Notes (Signed)
HPI :  41 year old female here for a follow-up visit. She was previously seen by Mike Gip in November for elevated liver enzymes. Since that time the patient has had lab workup for chronic liver diseases: negative ceruloplasmin, autoimmune markers, ferritin, and hepatitis panel. Alpha one ordered but not done. US showed fatty liver disease, negative for gallstones.   She denies any FH of liver disease. No history of liver disease. No alcohol use at all. She thinks she has gained weight over the past 3 years she thinks due to low thyroid. She is now on synthroid. She has weightabout 152 lbs after her child was born and now 184 lb over the past few years. She denies any steroid use. ALT has most recently been to 40s-50s.    Past Medical History:  Diagnosis Date  . Chlamydia infection 05/05/09  . Fatty liver disease, nonalcoholic   . Hypothyroidism   . Obesity (BMI 30-39.9)   . Trichomonas infection      Past Surgical History:  Procedure Laterality Date  . INTRAUTERINE DEVICE (IUD) INSERTION     Paragard 2012   Family History  Problem Relation Age of Onset  . Hypertension Mother   . Hyperlipidemia Mother    Social History  Substance Use Topics  . Smoking status: Never Smoker  . Smokeless tobacco: Never Used  . Alcohol use No   Current Outpatient Prescriptions  Medication Sig Dispense Refill  . levothyroxine (SYNTHROID) 25 MCG tablet Take 1 tablet (25 mcg total) by mouth daily before breakfast. 30 tablet 3   No current facility-administered medications for this visit.    No Known Allergies   Review of Systems: All systems reviewed and negative except where noted in HPI.   Lab Results  Component Value Date   WBC 6.5 11/17/2015   HGB 13.1 11/17/2015   HCT 39.5 11/17/2015   MCV 90.0 11/17/2015   PLT 346 11/17/2015    Lab Results  Component Value Date   CREATININE 0.66 11/17/2015   BUN 10 11/17/2015   NA 139 11/17/2015   K 3.6 11/17/2015   CL 105 11/17/2015     CO2 21 11/17/2015    Lab Results  Component Value Date   ALT 47 (H) 12/02/2015   AST 41 (H) 12/02/2015   ALKPHOS 66 11/17/2015   BILITOT 0.2 11/17/2015     Physical Exam: BP 104/80 (BP Location: Left Arm, Patient Position: Sitting, Cuff Size: Normal)   Pulse 60   Ht 5' 0.25" (1.53 m) Comment: height measured without shoes  Wt 184 lb 2 oz (83.5 kg)   LMP 01/26/2016   BMI 35.66 kg/m  Constitutional: Pleasant,well-developed, female in no acute distress. HEENT: Normocephalic and atraumatic. Conjunctivae are normal. No scleral icterus. Neck supple.  Cardiovascular: Normal rate, regular rhythm.  Pulmonary/chest: Effort normal and breath sounds normal. No wheezing, rales or rhonchi. Abdominal: Soft, protuberant, nontender. . There are no masses palpable. No hepatomegaly. Extremities: no edema Lymphadenopathy: No cervical adenopathy noted. Neurological: Alert and oriented to person place and time. Skin: Skin is warm and dry. No rashes noted. Psychiatric: Normal mood and affect. Behavior is normal.   ASSESSMENT AND PLAN: 41 year old female here for follow-up of fatty liver disease, likely related to obesity. Her ALT is only minimally elevated at this time.  I discussed fatty liver with her at length, to include long-term risks of Elita Boone and cirrhosis. I suspect this is related to her weight gain over the past 2 years. Mainstay of treatment is to  normalize BMI with diet and weight loss, I will refer her to see nutritionist for this issue and her obesity. Otherwise labs for chronic liver diseases were negative with she is not screen for alpha one antitrypsin deficiency, will send this and screen her for diabetes, and check for her immunity to hepatitis A and B. Recommend she avoid alcohol, but do recommend that she consume coffee routinely which can help prevent fibrotic changes. She should follow-up upper clinic at least yearly for this issue. All questions answered.  Ileene PatrickSteven Armbruster,  MD [facility] Pager [phone]

## 2016-02-17 LAB — ALPHA-1-ANTITRYPSIN: A1 ANTITRYPSIN SER: 134 mg/dL (ref 83–199)

## 2016-02-18 ENCOUNTER — Other Ambulatory Visit: Payer: Self-pay

## 2016-02-18 ENCOUNTER — Telehealth: Payer: Self-pay

## 2016-02-18 DIAGNOSIS — R7989 Other specified abnormal findings of blood chemistry: Secondary | ICD-10-CM

## 2016-02-18 DIAGNOSIS — R945 Abnormal results of liver function studies: Principal | ICD-10-CM

## 2016-02-18 NOTE — Telephone Encounter (Signed)
-----   Message from Ruffin FrederickSteven Paul Armbruster, MD sent at 02/18/2016 10:47 AM EST ----- Sheryl Logan can you please relay the following to this patient: - she tested negative for diabetes - she is immune to hep A - she is NOT immune to hep B - recommend she have vaccine to hepatitis B if you can help coordinate.  Otherwise, repeat LFTs in 6 months and I can see her again in clinic in one year. Thanks

## 2016-02-18 NOTE — Telephone Encounter (Signed)
Pt informed of results. She will come in Monday Feb 5th at 3pm for first Hep B injection. Order has been placed. She was also made aware to repeat LFT lab in 6 months. Order placed for this as well.

## 2016-02-21 ENCOUNTER — Ambulatory Visit (INDEPENDENT_AMBULATORY_CARE_PROVIDER_SITE_OTHER): Payer: Commercial Managed Care - PPO | Admitting: Gastroenterology

## 2016-02-21 DIAGNOSIS — Z23 Encounter for immunization: Secondary | ICD-10-CM | POA: Diagnosis not present

## 2016-02-21 DIAGNOSIS — K760 Fatty (change of) liver, not elsewhere classified: Secondary | ICD-10-CM

## 2016-02-25 ENCOUNTER — Other Ambulatory Visit (INDEPENDENT_AMBULATORY_CARE_PROVIDER_SITE_OTHER): Payer: Commercial Managed Care - PPO

## 2016-02-25 DIAGNOSIS — E038 Other specified hypothyroidism: Secondary | ICD-10-CM | POA: Diagnosis not present

## 2016-02-25 DIAGNOSIS — E063 Autoimmune thyroiditis: Secondary | ICD-10-CM

## 2016-02-25 LAB — T4, FREE: FREE T4: 0.79 ng/dL (ref 0.60–1.60)

## 2016-02-25 LAB — TSH: TSH: 4.41 u[IU]/mL (ref 0.35–4.50)

## 2016-02-29 ENCOUNTER — Ambulatory Visit (INDEPENDENT_AMBULATORY_CARE_PROVIDER_SITE_OTHER): Payer: Commercial Managed Care - PPO | Admitting: Endocrinology

## 2016-02-29 ENCOUNTER — Encounter: Payer: Self-pay | Admitting: Endocrinology

## 2016-02-29 VITALS — BP 118/70 | HR 58 | Ht 60.0 in | Wt 186.0 lb

## 2016-02-29 DIAGNOSIS — E063 Autoimmune thyroiditis: Secondary | ICD-10-CM

## 2016-02-29 DIAGNOSIS — E038 Other specified hypothyroidism: Secondary | ICD-10-CM | POA: Diagnosis not present

## 2016-02-29 NOTE — Progress Notes (Signed)
Patient ID: Sheryl ClarksRocio Haydu, female   DOB: 11/22/1975, 41 y.o.   MRN: [id number]             Reason for Appointment:  Hypothyroidism, follow-up visit  Referring physician: Dr. Lily PeerFernandez   History of Present Illness:   Hypothyroidism was first diagnosed in 11/17  At the time of diagnosis patient was having symptoms of  fatigue for about a year She also thinks she has had gradual weight gain over the last few years of about 30 pounds. She has had some chronic cold sensitivity but no difficulty concentrating, dry skin. 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      Since her TSH was consistently high she was started on levothyroxine 25 g daily on her initial consultation as a trial  She does think that she has more energy and is feeling more normal, does think that her concentration is a little better also Also has less brittle nails Her weight however has not improved yet         Patient's weight history is as follows:   Wt Readings from Last 3 Encounters:  02/29/16 186 lb (84.4 kg)  02/15/16 184 lb 2 oz (83.5 kg)  12/29/15 182 lb (82.6 kg)    Thyroid function results have been as follows:  Lab Results  Component Value Date   FREET4 0.79 02/25/2016   TSH 4.41 02/25/2016   TSH 7.31 (H) 12/02/2015   TSH 4.76 (H) 11/17/2015     Past Medical History:  Diagnosis Date  . Chlamydia infection 05/05/09  . Fatty liver disease, nonalcoholic   . Hypothyroidism   . Obesity (BMI 30-39.9)   . Trichomonas infection     Past Surgical History:  Procedure Laterality Date  . INTRAUTERINE DEVICE (IUD) INSERTION     Paragard 2012    Family History  Problem Relation Age of Onset  . Hypertension Mother   . Hyperlipidemia Mother     Social History:  reports that she has never smoked. She has never used smokeless tobacco. She reports that she does not drink alcohol. Her drug history is not on file.  Allergies: No Known Allergies  Allergies as  of 02/29/2016   No Known Allergies     Medication List       Accurate as of 02/29/16  4:31 PM. Always use your most recent med list.          levothyroxine 25 MCG tablet Commonly known as:  SYNTHROID Take 1 tablet (25 mcg total) by mouth daily before breakfast.          Review of Systems              Examination:    BP 118/70   Pulse (!) 58   Ht 5' (1.524 m)   Wt 186 lb (84.4 kg)   SpO2 97%   BMI 36.33 kg/m   Thyroid not palpable Biceps reflexes normal   Assessment:  HYPOTHYROIDISM, mild with nonspecific symptoms of fatigue and weight gain With a trial of 25 g of levothyroxine she does subjectively feels better with energy level She also thinks she has less brittle nails and better focusing  PLAN:   For now she can continue the 25 g dosage even though her TSH is still around 4.0 Follow-up in 4 months, discussed need for lifelong supplementation  Kamilya Wakeman 02/29/2016, 4:31 PM

## 2016-03-16 ENCOUNTER — Ambulatory Visit: Payer: Commercial Managed Care - PPO | Admitting: Dietician

## 2016-03-20 ENCOUNTER — Ambulatory Visit (INDEPENDENT_AMBULATORY_CARE_PROVIDER_SITE_OTHER): Payer: Commercial Managed Care - PPO | Admitting: Gastroenterology

## 2016-03-20 DIAGNOSIS — Z23 Encounter for immunization: Secondary | ICD-10-CM

## 2016-03-20 DIAGNOSIS — K760 Fatty (change of) liver, not elsewhere classified: Secondary | ICD-10-CM

## 2016-05-31 ENCOUNTER — Encounter: Payer: Self-pay | Admitting: Gynecology

## 2016-06-23 ENCOUNTER — Other Ambulatory Visit: Payer: Commercial Managed Care - PPO

## 2016-06-28 ENCOUNTER — Ambulatory Visit: Payer: Commercial Managed Care - PPO | Admitting: Endocrinology

## 2016-06-29 ENCOUNTER — Telehealth: Payer: Self-pay | Admitting: Endocrinology

## 2016-06-29 NOTE — Telephone Encounter (Signed)
Follow up necessary. Contact patient and schedule visit in 15-30 days  

## 2016-06-29 NOTE — Telephone Encounter (Signed)
Patient no showed today's appt. Please advise on how to follow up. °A. No follow up necessary. °B. Follow up urgent. Contact patient immediately. °C. Follow up necessary. Contact patient and schedule visit in ___ days. °D. Follow up advised. Contact patient and schedule visit in ____weeks. ° °

## 2016-08-10 NOTE — Telephone Encounter (Signed)
Pt currently does not have insurance and does not want to come in until she has it again.

## 2017-04-08 IMAGING — DX DG FOOT COMPLETE 3+V*R*
3 series · 3 of 3 positions shown · non-contrast
Comparison: None.

CLINICAL DATA: Jamming injury to the right small toe yesterday
while mowing the lawn. Persistent pain. Initial encounter.

EXAM:
RIGHT FOOT COMPLETE - 3+ VIEW

[foot ap]
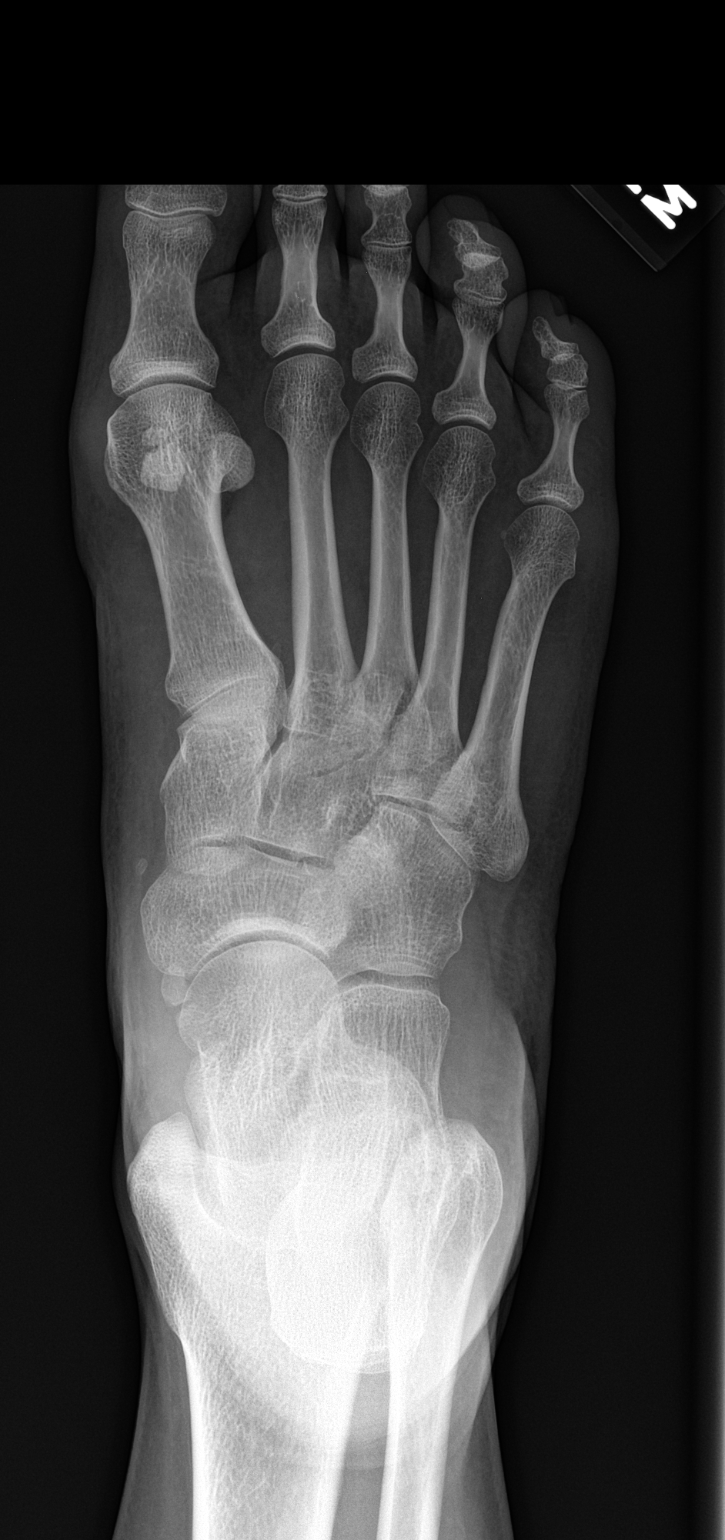

[foot obl]
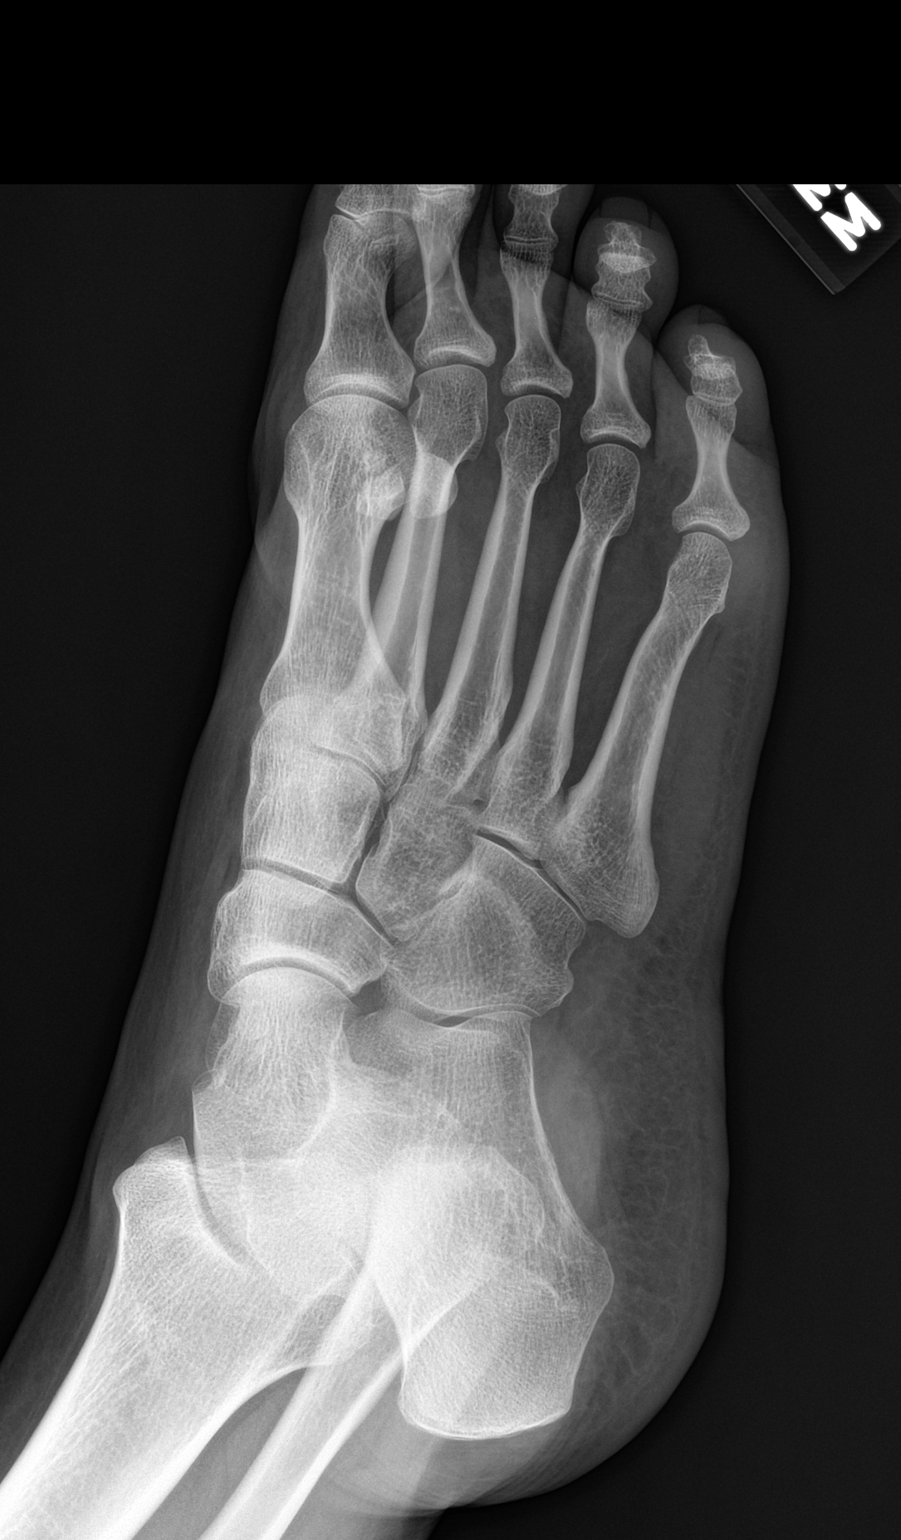

[foot lat]
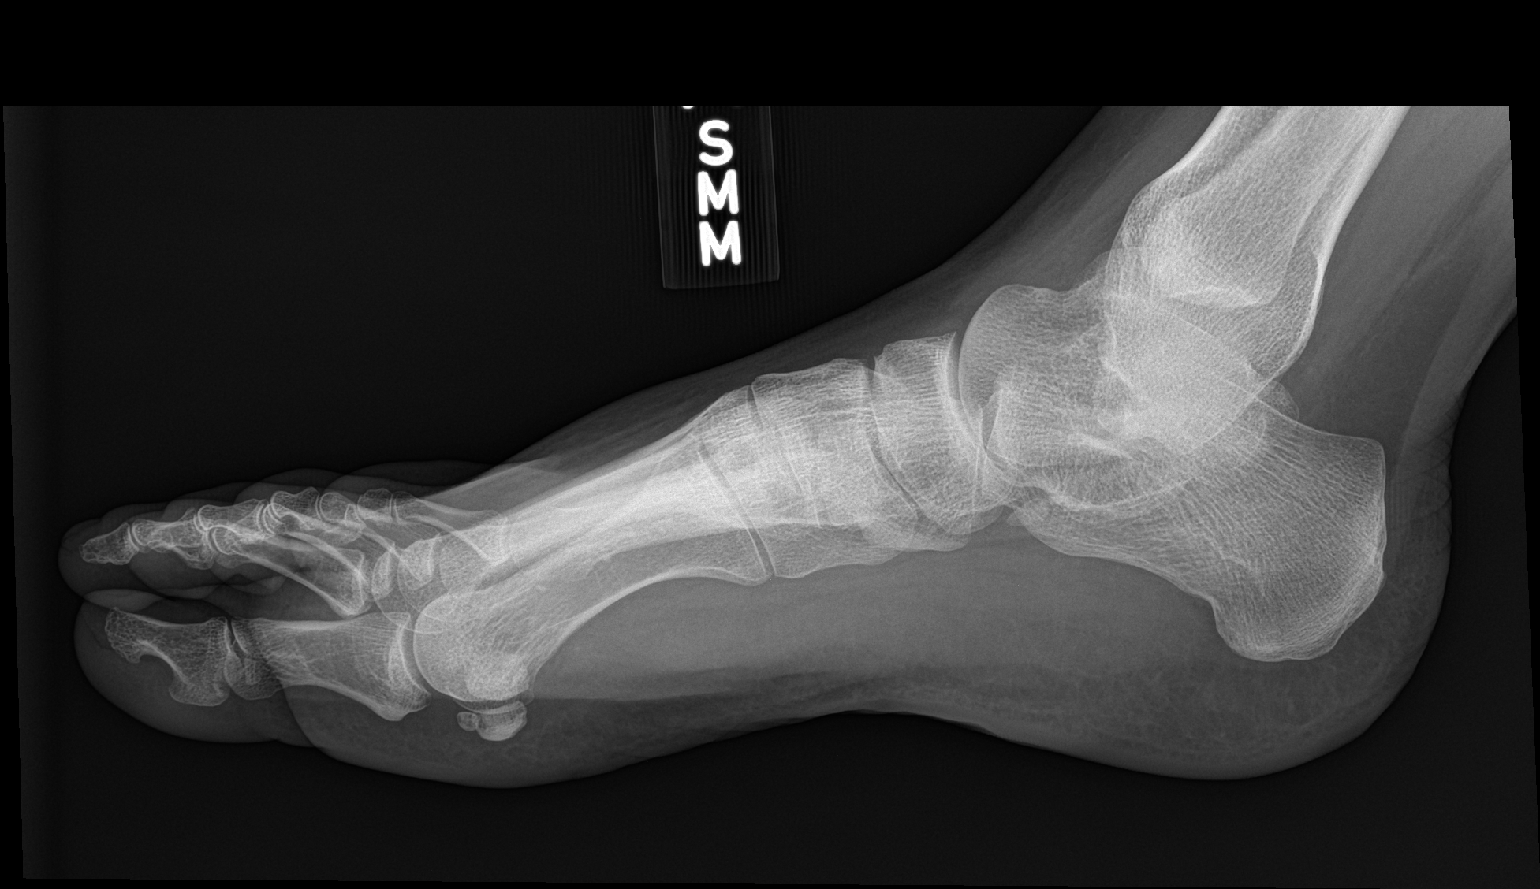

[3 of 3 positions shown; findings below may reference images not displayed]

FINDINGS: Soft tissue swelling involving the small toe. No evidence of acute
fracture or dislocation. Joint spaces well preserved. Well-preserved
bone mineral density. No intrinsic osseous abnormalities.
IMPRESSION: No osseous abnormality.

## 2019-04-11 ENCOUNTER — Other Ambulatory Visit: Payer: Self-pay

## 2019-04-14 ENCOUNTER — Ambulatory Visit: Payer: Commercial Managed Care - PPO | Admitting: Obstetrics and Gynecology

## 2019-04-25 ENCOUNTER — Other Ambulatory Visit: Payer: Self-pay

## 2019-04-28 ENCOUNTER — Other Ambulatory Visit: Payer: Self-pay

## 2019-04-28 ENCOUNTER — Encounter: Payer: Self-pay | Admitting: Nurse Practitioner

## 2019-04-28 ENCOUNTER — Ambulatory Visit (INDEPENDENT_AMBULATORY_CARE_PROVIDER_SITE_OTHER): Payer: Self-pay | Admitting: Nurse Practitioner

## 2019-04-28 VITALS — BP 124/80 | Ht 61.0 in | Wt 173.0 lb

## 2019-04-28 DIAGNOSIS — N926 Irregular menstruation, unspecified: Secondary | ICD-10-CM

## 2019-04-28 DIAGNOSIS — Z13220 Encounter for screening for lipoid disorders: Secondary | ICD-10-CM

## 2019-04-28 DIAGNOSIS — Z01419 Encounter for gynecological examination (general) (routine) without abnormal findings: Secondary | ICD-10-CM

## 2019-04-28 NOTE — Progress Notes (Signed)
Akaya Proffit January 06, 1976 [id number]    History:    44 year old MHF G6P4 presents today for annual exam, interpreter present. Complains of irregular periods, occurring about every 3 months, lighter bleeding than normal. LMP 04/11/2019. No contraceptive, had IUD removed 10/2015, does not wish to restart. Last pap smear 2017-negative, Mammogram 2017-negative. Has not scheduled mammogram due to lack of insurance. Would like annual labs done today. Denies any menopausal symptoms. Requesting information on what to expect and how to manage these symptoms if they do occur.   Past medical history, past surgical history, family history and social history were all reviewed and documented in the EPIC chart.  ROS:  A ROS was performed and pertinent positives and negatives are included.  Exam:  Vitals:   04/28/19 0942  BP: 124/80  Weight: 173 lb (78.5 kg)  Height: 5\' 1"  (1.549 m)   Body mass index is 32.69 kg/m.   General appearance:  Normal Thyroid:  Symmetrical, normal in size, without palpable masses or nodularity. Respiratory  Auscultation:  Clear without wheezing or rhonchi Cardiovascular  Auscultation:  Regular rate, without rubs, murmurs or gallops  Edema/varicosities:  Not grossly evident Abdominal  Soft,nontender, without masses, guarding or rebound.  Liver/spleen:  No organomegaly noted  Hernia:  None appreciated  Skin  Inspection:  Grossly normal   Breasts: Examined lying and sitting.     Right: Without masses, retractions, discharge or axillary adenopathy.     Left: Without masses, retractions, discharge or axillary adenopathy. Gentitourinary   Inguinal/mons:  Normal without inguinal adenopathy  External genitalia:  Normal  BUS/Urethra/Skene's glands:  Normal  Vagina:  Normal, no gross lesions or discharge  Cervix:  Normal, no gross lesions or discharge  Uterus:  anteverted, normal in size, shape and contour.  Midline and mobile  Adnexa/parametria:     Rt: Without masses  or tenderness.   Lt: Without masses or tenderness.  Anus and perineum: Normal   Assessment/Plan:  44 y.o.  for annual exam.    Metrorrhagia  Plan: Pap smear with HPV pending, labs pending: CBC, CMP, TSH, glucose, Lipid panel, FSH. Encouraged to schedule mammogram and educated on the importance of screening-resources provided on low cost mammogram options. Handouts provided on health maintenance including diet, exercise, SBEs, multivitamin, and menopausal symptoms.  Follow up in 1 year for annual.     [address] [facility], 9:50 AM 04/28/2019

## 2019-04-28 NOTE — Patient Instructions (Signed)
La menopausia Menopause La menopausia es el perodo normal de la vida en el que los perodos menstruales cesan por completo. Por lo general, se confirma tras 63meses de no haber tenido un perodo menstrual. La transicin a la menopausia (perimenopausia), con mayor frecuencia, ocurre entre los 79 y los 55aos. Durante la perimenopausia, los niveles hormonales cambian en el [place], lo que puede provocar sntomas y Psychologist, occupational. La menopausia podra aumentar el riesgo de sufrir lo siguiente:  Prdida de masa sea (osteoporosis), lo que predispone a la rotura de huesos (fracturas).  Depresin.  Endurecimiento y Public librarian de las arterias (aterosclerosis), lo que puede causar infartos de miocardio y accidentes cerebrovasculares. Cules son las causas? A menudo, la causa de esta afeccin es un cambio natural en los niveles hormonales, que ocurre a medida que envejece. La afeccin tambin podra ser provocada por una ciruga en la que se extraigan ambos ovarios (ooforectoma bilateral). Qu incrementa el riesgo? Es ms probable que esta afeccin comience a una temprana edad si tiene ciertas afecciones o se realiza ciertos tratamientos, incluidos los siguientes:  Un tumor en la hipfisis del cerebro.  Una enfermedad que afecte los ovarios y la produccin de hormonas.  Radioterapia para tratar Science writer.  Ciertos tratamientos contra el cncer, como quimioterapia o terapia hormonal (antiestrgeno).  Fumar mucho y consumir alcohol de forma excesiva.  Antecedentes familiares de menopausia temprana. Adems, es ms probable que esta afeccin se presente de manera temprana en las mujeres que son [place]. Cules son los signos o los sntomas? Los sntomas de esta afeccin incluyen los siguientes:  Nurse, learning disability.  Perodos menstruales irregulares.  Sudoracin nocturna.  Cambios en el sentimiento respecto de las [employer]. Es posible que el deseo sexual disminuya o  que se sienta ms cmoda respecto de su sexualidad.  Sequedad vaginal y adelgazamiento de las paredes de la vagina. Esto podra hacer que sienta dolor durante las relaciones sexuales.  Sequedad de la piel y aparicin de Glass blower/designer.  Dolores de [place].  Problemas para dormir (insomnio).  Cambios de humor o irritabilidad.  Problemas de memoria.  Aumento de [place].  Crecimiento de bello en la cara y el pecho.  Infecciones en la vejiga o problemas para orinar. Cmo se diagnostica? Esta afeccin se diagnostica en funcin de los antecedentes mdicos, un examen fsico, la edad, los antecedentes menstruales y los sntomas. Tambin le podran realizar estudios hormonales. Cmo se trata? En algunos los casos, no se necesita tratamiento. Usted y el mdico deben decidir juntos si se debe Arts administrator. El tratamiento se determinar en funcin de su cuadro clnico y de sus preferencias. El tratamiento de este cuadro clnico se centra en el control de los sntomas. El tratamiento puede incluir lo siguiente:  Terapia hormonal para la menopausia.  Medicamentos para tratar sntomas o complicaciones especficos.  Acupuntura.  Vitaminas o suplementos herbales. Antes de Biochemist, clinical, es importante que le avise al mdico si tiene antecedentes personales o familiares de lo siguiente:  Enfermedades cardacas.  Cncer de mama.  Cogulos de [place].  Diabetes.  Osteoporosis. Siga estas indicaciones en su casa: Estilo de vida  No consuma ningn producto que contenga nicotina o tabaco, como cigarrillos y Psychologist, sport and exercise. Si necesita ayuda para dejar de fumar, consulte al mdico.  Realice, por lo menos, [id number] de actividad fsica 5das por semana o ms.  Evite las bebidas con alcohol o cafena, as como las [employer]. Esto podra ayudar a prevenir los acaloramientos.  Intente dormir de  7 a 8horas todas las noches.  Si tiene  acaloramientos: ? Vstase en capas. ? Evite las cosas que podran Barnes & Nobledesencadenar los acaloramientos, como las comidas muy condimentadas, los lugares calientes o el estrs. ? Respire profundamente y despacio cuando comience a Scientist, water qualitysentir acaloramiento. ? Tenga un ventilador en su casa y en su oficina.  Encuentre modos de MGM MIRAGEcontrolar el estrs, por ejemplo, a travs de la respiracin, meditacin o un diario ntimo.  Considere la posibilidad de asistir a terapia grupal con otras mujeres que tengan sntomas de McKeemenopausia. Pdale recomendaciones al The Procter & Gamblemdico sobre reuniones de terapia grupal. Comida y bebida  Siga una dieta saludable y equilibrada que incluya cereales integrales, protenas magras, productos lcteos descremados y Jacksonmuchas frutas y verduras.  El mdico podra recomendarle que agregue una mayor cantidad de soja a su dieta. Algunos de los alimentos que contienen soja son el tofu, el tempeh y la [place] de soja.  Consuma muchos alimentos que contengan calcio y vitaminaD para Nutritional therapistmejorar la salud sea. Algunos productos que contienen mucho calcio son los Enterprise Productslcteos descremados, el yogur, los frijoles, las [place]lake Villagealmendras, las sardinas, el brcoli y la col rizada. Medicamentos  Baxter Internationalome los medicamentos de venta libre y los recetados solamente como se lo haya indicado el mdico.  Hable con el mdico antes de Corporate investment bankerempezar a tomar cualquier suplemento herbal. Tome las vitaminas y los suplementos recetados como se lo haya indicado el mdico. Estos pueden incluir lo siguiente: ? Calcio. Las mujeres de 51aos o ms deben consumir 1200mg  (miligramos) de Fiservcalcio todos los das. ? VitaminaD. Las mujeres necesitan entre 600 y 800unidades internacionales de vitaminaD todos [place]los das. ? VitaminasB12 yB6. Procure consumir 50microgramos de vitaminaB12 y 1,5mg  de vitaminaB6 todos los [address]  [address]. Instrucciones generales  Lleve un registro de sus perodos menstruales; incluya lo siguiente: ? El momento en que ocurren. ? Qu tan  abundantes son y cunto duran. ? Cunto tiempo transcurre entre cada perodo menstrual.  Lleve un registro de los sntomas; anote cundo comienzan, con qu frecuencia ocurren y cunto duran.  Use lubricantes o humectantes vaginales para aliviar la sequedad vaginal y Personnel officermejorar el bienestar durante las relaciones sexuales.  Concurra a todas las visitas de control como se lo haya indicado el mdico. Esto es importante. Esto incluye la terapia grupal y la psicoterapia. Comunquese con un mdico si:  An tiene perodos menstruales despus de los 55aos.  Siente dolor durante las The St. Paul Travelersrelaciones sexuales.  No tuvo perodos menstruales durante los ltimos 12meses y presenta sangrado vaginal. Solicite ayuda de inmediato si:  Tiene los siguientes sntomas: ? Depresin grave. ? Sangrado vaginal excesivo. ? Dolor al ConocoPhillipsorinar. ? Latidos cardacos rpidos o irregulares (palpitaciones). ? Dolor de cabeza intenso. ? Dolor en el abdomen (abdominal) o dispepsia grave.  Se cay y cree que se ha fracturado un hueso.  Siente dolor en el pecho o en la pierna.  Desarrolla problemas de visin.  Se toca un bulto en la mama. Resumen  La menopausia es el perodo normal de la vida en el que los perodos menstruales cesan por completo. Por lo general, se confirma tras 12meses de no haber tenido un perodo menstrual.  La transicin a la menopausia (perimenopausia), con mayor frecuencia, ocurre entre los 45 y los 55aos.  Los sntomas pueden controlarse mediante medicamentos, cambios en el estilo de vida y terapias complementarias, como acupuntura.  Siga una dieta equilibrada que incluya muchos nutrientes para Biochemist, clinicalfavorecer la salud sea y la salud cardaca, y para Chief Operating Officercontrolar los sntomas durante la Ontariomenopausia. Esta informacin no tiene  como fin reemplazar el consejo del mdico. Asegrese de hacerle al mdico cualquier pregunta que tenga. Document Revised: 09/28/2016 Document Reviewed: 09/28/2016 Elsevier Patient  Education  2020 Arvin[employer]. Mantenimiento de la salud en las mujeres Health Maintenance, Female Adoptar un estilo de vida saludable y recibir atencin preventiva son importantes para promover la salud y Counsellor. Consulte al mdico sobre:  El esquema adecuado para hacerse pruebas y exmenes peridicos.  Cosas que puede hacer por su cuenta para prevenir enfermedades y [employer]ent Financial. Qu debo saber sobre la dieta, el peso y el ejercicio? Consuma una dieta saludable   Consuma una dieta que incluya muchas verduras, frutas, productos lcteos con bajo contenido de [place] y Associate Professor.  No consuma muchos alimentos ricos en grasas slidas, azcares agregados o sodio. Mantenga un peso saludable El ndice de masa muscular [facility]) se [place] para identificar problemas de [place]. Proporciona una estimacin de la grasa corporal basndose en el peso y la altura. Su mdico puede ayudarle a Engineer, site IMC y a Personnel officer o Pharmacologist un peso saludable. Haga ejercicio con regularidad Haga ejercicio con regularidad. Esta es una de las prcticas ms importantes que puede hacer por su salud. La mayora de los adultos deben seguir estas pautas:  Education officer, environmental, al menos, de actividad fsica por semana. El ejercicio debe aumentar la frecuencia cardaca y Media planner transpirar (ejercicio de intensidad moderada).  Hacer ejercicios de fortalecimiento por lo [employer] por semana. Agregue esto a su plan de ejercicio de intensidad moderada.  Pasar menos tiempo sentados. Incluso la actividad fsica ligera puede ser beneficiosa. Controle sus niveles de colesterol y lpidos en la sangre Comience a realizarse anlisis de lpidos y Oncologist en la sangre a los 20aos y luego reptalos cada 5aos. Hgase controlar los niveles de colesterol con mayor frecuencia si:  Sus niveles de lpidos y colesterol son altos.  Es mayor de 40aos.  Presenta un alto riesgo de padecer enfermedades cardacas. Qu  debo saber sobre las pruebas de deteccin del cncer? Segn su historia clnica y sus antecedentes familiares, es posible que deba realizarse pruebas de deteccin del cncer en diferentes edades. Esto puede incluir pruebas de deteccin de lo siguiente:  Cncer de mama.  Cncer de cuello uterino.  Cncer colorrectal.  Cncer de piel.  Cncer de pulmn. Qu debo saber sobre la enfermedad cardaca, la diabetes y la hipertensin arterial? Presin arterial y enfermedad cardaca  La hipertensin arterial causa enfermedades cardacas y [place] el riesgo de accidente cerebrovascular. Es ms probable que esto se manifieste en las personas que tienen lecturas de presin arterial alta, tienen ascendencia africana o tienen sobrepeso.  Hgase controlar la presin arterial: ? Cada 3 a 5 aos si tiene entre 18 y 46 aos. ? Todos los aos si es mayor de [place]. Diabetes Realcese exmenes de deteccin de la diabetes con regularidad. Este anlisis revisa el nivel de azcar en la sangre en [place]. Hgase las pruebas de deteccin:  Cada tresaos despus de los 40aos de edad si tiene un peso normal y un bajo riesgo de padecer diabetes.  Con ms frecuencia y a partir de [place] edad inferior si tiene sobrepeso o un alto riesgo de padecer diabetes. Qu debo saber sobre la prevencin de infecciones? Hepatitis B Si tiene un riesgo ms alto de contraer hepatitis B, debe someterse a un examen de deteccin de este virus. Hable con el mdico para averiguar si tiene riesgo de contraer la infeccin por hepatitis B. Hepatitis C Se recomienda el anlisis a:  Todos los que nacieron entre 1945 y [phone].  Todas las personas que tengan un riesgo de haber contrado hepatitis C. Enfermedades de transmisin sexual (ETS)  Hgase las pruebas de Airline pilot de ITS, incluidas la gonorrea y la clamidia, si: ? Es sexualmente activa y es menor de [place]. ? Es mayor de 24aos, y Public affairs consultant informa que corre riesgo de  tener este tipo de infecciones. ? La actividad sexual ha cambiado desde que le hicieron la ltima prueba de deteccin y tiene un riesgo mayor de Warehouse manager clamidia o Copy. Pregntele al mdico si usted tiene riesgo.  Pregntele al mdico si usted tiene un alto riesgo de Primary school teacher VIH. El mdico tambin puede recomendarle un medicamento recetado para ayudar a evitar la infeccin por el VIH. Si elige tomar medicamentos para prevenir el VIH, primero debe ONEOK de deteccin del VIH. Luego debe hacerse anlisis cada mientras est tomando los medicamentos. Embarazo  Si est por dejar de Armed forces training and education officer (fase premenopusica) y usted puede [place], busque asesoramiento antes de [place].  Tome de 400 a (mcg) de cido E[employer] si [place].  Pida mtodos de control de la natalidad (anticonceptivos) si desea evitar un embarazo no deseado. Osteoporosis y [place] La osteoporosis es una enfermedad en la que los huesos pierden los minerales y la fuerza por el avance de la edad. El resultado pueden ser fracturas en los [place]. Si tiene 65aos o ms, o si est en riesgo de sufrir osteoporosis y fracturas, pregunte a su mdico si debe:  Hacerse pruebas de deteccin de prdida sea.  Tomar un suplemento de calcio o de vitamina D para reducir el riesgo de fracturas.  Recibir terapia de reemplazo hormonal (TRH) para tratar los sntomas de la menopausia. Siga estas instrucciones en su casa: Estilo de vida  No consuma ningn producto que contenga nicotina o tabaco, como cigarrillos, cigarrillos electrnicos y tabaco de Theatre manager. Si necesita ayuda para dejar de fumar, consulte al mdico.  No consuma drogas.  No comparta agujas.  Solicite ayuda a su mdico si necesita apoyo o informacin para abandonar las drogas. Consumo de alcohol  No beba alcohol si: ? Su mdico le indica no hacerlo. ? Est embarazada, puede estar embarazada o est  tratando de quedar embarazada.  Si bebe alcohol: ? Limite la cantidad que consume de 0 a 1 medida por da. ? Limite la ingesta si est amamantando.  Est atento a la cantidad de alcohol que hay en las bebidas que toma. En los [place], una medida equivale a una botella de cerveza de 12oz ( ), un vaso de vino de 5oz ( ) o un vaso de una bebida alcohlica de alta graduacin de 1oz (61ml). Instrucciones generales  Realcese los estudios de rutina de la salud, dentales y de Wellsite geologist.  Mantngase al da con las vacunas.  Infrmele a su mdico si: ? Se siente deprimida con frecuencia. ? Alguna vez ha sido vctima de [place] o no se siente segura en su casa. Resumen  Adoptar un estilo de vida saludable y recibir atencin preventiva son importantes para promover la salud y Counsellor.  Siga las instrucciones del mdico acerca de una dieta saludable, el ejercicio y la realizacin de pruebas o exmenes para Hotel manager.  Siga las instrucciones del mdico con respecto al control del colesterol y la presin arterial. Esta informacin no tiene Theme park manager el consejo del mdico. Asegrese de hacerle al mdico cualquier pregunta que tenga. Document Revised:  01/23/2018 Document Reviewed: 01/23/2018 Elsevier Patient Education  2020 Arvin[employer].

## 2019-04-29 ENCOUNTER — Other Ambulatory Visit: Payer: Self-pay | Admitting: Nurse Practitioner

## 2019-04-29 LAB — CBC WITH DIFFERENTIAL/PLATELET
Absolute Monocytes: 358 cells/uL (ref 200–950)
Basophils Absolute: 22 cells/uL (ref 0–200)
Basophils Relative: 0.4 %
Eosinophils Absolute: 77 cells/uL (ref 15–500)
Eosinophils Relative: 1.4 %
HCT: 42.1 % (ref 35.0–45.0)
Hemoglobin: 13.9 g/dL (ref 11.7–15.5)
Lymphs Abs: 1667 cells/uL (ref 850–3900)
MCH: 30.1 pg (ref 27.0–33.0)
MCHC: 33 g/dL (ref 32.0–36.0)
MCV: 91.1 fL (ref 80.0–100.0)
MPV: 9.3 fL (ref 7.5–12.5)
Monocytes Relative: 6.5 %
Neutro Abs: 3377 cells/uL (ref 1500–7800)
Neutrophils Relative %: 61.4 %
Platelets: 336 10*3/uL (ref 140–400)
RBC: 4.62 10*6/uL (ref 3.80–5.10)
RDW: 12.3 % (ref 11.0–15.0)
Total Lymphocyte: 30.3 %
WBC: 5.5 10*3/uL (ref 3.8–10.8)

## 2019-04-29 LAB — COMPREHENSIVE METABOLIC PANEL
AG Ratio: 1.1 (calc) (ref 1.0–2.5)
ALT: 49 U/L — ABNORMAL HIGH (ref 6–29)
AST: 52 U/L — ABNORMAL HIGH (ref 10–30)
Albumin: 3.9 g/dL (ref 3.6–5.1)
Alkaline phosphatase (APISO): 62 U/L (ref 31–125)
BUN: 7 mg/dL (ref 7–25)
CO2: 28 mmol/L (ref 20–32)
Calcium: 8.7 mg/dL (ref 8.6–10.2)
Chloride: 102 mmol/L (ref 98–110)
Creat: 0.74 mg/dL (ref 0.50–1.10)
Globulin: 3.7 g/dL (calc) (ref 1.9–3.7)
Glucose, Bld: 93 mg/dL (ref 65–99)
Potassium: 3.4 mmol/L — ABNORMAL LOW (ref 3.5–5.3)
Sodium: 137 mmol/L (ref 135–146)
Total Bilirubin: 0.4 mg/dL (ref 0.2–1.2)
Total Protein: 7.6 g/dL (ref 6.1–8.1)

## 2019-04-29 LAB — LIPID PANEL
Cholesterol: 158 mg/dL (ref <200)
HDL: 33 mg/dL — ABNORMAL LOW (ref >=50)
LDL Cholesterol (Calc): 100 mg/dL (calc) — ABNORMAL HIGH
Non-HDL Cholesterol (Calc): 125 mg/dL (calc) (ref <130)
Total CHOL/HDL Ratio: 4.8 (calc) (ref <5.0)
Triglycerides: 148 mg/dL (ref <150)

## 2019-04-29 LAB — FOLLICLE STIMULATING HORMONE: FSH: 10 m[IU]/mL

## 2019-04-29 LAB — TSH: TSH: 1.58 mIU/L

## 2019-04-30 ENCOUNTER — Other Ambulatory Visit: Payer: Self-pay | Admitting: Internal Medicine

## 2019-04-30 DIAGNOSIS — Z1231 Encounter for screening mammogram for malignant neoplasm of breast: Secondary | ICD-10-CM

## 2019-04-30 LAB — PAP, TP IMAGING W/ HPV RNA, RFLX HPV TYPE 16,18/45: HPV DNA High Risk: NOT DETECTED

## 2019-04-30 LAB — URINALYSIS, COMPLETE W/RFL CULTURE
Bacteria, UA: NONE SEEN /HPF
Bilirubin Urine: NEGATIVE
Glucose, UA: NEGATIVE
Hyaline Cast: NONE SEEN /LPF
Ketones, ur: NEGATIVE
Nitrites, Initial: NEGATIVE
Protein, ur: NEGATIVE
RBC / HPF: NONE SEEN /HPF (ref 0–2)
Specific Gravity, Urine: 1.013 (ref 1.001–1.03)
pH: 6.5 (ref 5.0–8.0)

## 2019-04-30 LAB — URINE CULTURE
MICRO NUMBER:: 10354851
Result:: NO GROWTH
SPECIMEN QUALITY:: ADEQUATE

## 2019-04-30 LAB — CULTURE INDICATED

## 2019-05-01 NOTE — Progress Notes (Signed)
Patient informed of lab/pap smear results.

## 2019-06-11 ENCOUNTER — Ambulatory Visit
Admission: RE | Admit: 2019-06-11 | Discharge: 2019-06-11 | Disposition: A | Discharge status: Home / Self Care | Payer: No Typology Code available for payment source | Source: Ambulatory Visit | Attending: Internal Medicine | Admitting: Internal Medicine

## 2019-06-11 ENCOUNTER — Other Ambulatory Visit: Payer: Self-pay

## 2019-06-11 DIAGNOSIS — Z1231 Encounter for screening mammogram for malignant neoplasm of breast: Secondary | ICD-10-CM

## 2020-05-31 ENCOUNTER — Other Ambulatory Visit: Payer: Self-pay | Admitting: Obstetrics and Gynecology

## 2020-05-31 DIAGNOSIS — Z1231 Encounter for screening mammogram for malignant neoplasm of breast: Secondary | ICD-10-CM

## 2020-07-01 ENCOUNTER — Ambulatory Visit
Admission: RE | Admit: 2020-07-01 | Discharge: 2020-07-01 | Disposition: A | Discharge status: Home / Self Care | Payer: No Typology Code available for payment source | Source: Ambulatory Visit | Attending: Obstetrics and Gynecology | Admitting: Obstetrics and Gynecology

## 2020-07-01 ENCOUNTER — Ambulatory Visit: Payer: Self-pay | Admitting: *Deleted

## 2020-07-01 ENCOUNTER — Other Ambulatory Visit: Payer: Self-pay

## 2020-07-01 VITALS — BP 122/82 | Wt 188.0 lb

## 2020-07-01 DIAGNOSIS — Z1239 Encounter for other screening for malignant neoplasm of breast: Secondary | ICD-10-CM

## 2020-07-01 DIAGNOSIS — Z1231 Encounter for screening mammogram for malignant neoplasm of breast: Secondary | ICD-10-CM

## 2020-07-01 NOTE — Patient Instructions (Signed)
Explained breast self awareness with Sheryl Logan. Patient did not need a Pap smear today due to last Pap smear and HPV typing was 04/28/2019. Let her know BCCCP will cover Pap smears and HPV typing every 5 years unless has a history of abnormal Pap smears. Referred patient to the [facility] for a screening mammogram on the mobile unit. Appointment scheduled Thursday, July 01, 2020 at 1120. Patient escorted to the mobile unit following BCCCP appointment for her screening mammogram. Let patient know the Breast Center will follow up with her within the next couple weeks with results of her mammogram by letter or phone. Sheryl Logan verbalized understanding.  Sheryl Logan, Sheryl Maser, RN 10:31 AM

## 2020-07-01 NOTE — Progress Notes (Addendum)
Sheryl Logan is a 45 y.o. female who presents to [facility] clinic today with no complaints.    Pap Smear: Pap smear not completed today. Last Pap smear was 04/28/2019 at [facility] clinic and was normal with negative HPV. Per patient has no history of an abnormal Pap smear. Last Pap smear result is available in Epic.   Physical exam: Breasts Breasts symmetrical. No skin abnormalities bilateral breasts. No nipple retraction bilateral breasts. No nipple discharge bilateral breasts. No lymphadenopathy. No lumps palpated bilateral breasts. No complaints of pain or tenderness on exam.    MM SCREENING BREAST TOMO BILATERAL  Result Date: 12/17/2015 CLINICAL DATA:  Screening. EXAM: 2D DIGITAL SCREENING BILATERAL MAMMOGRAM WITH CAD AND ADJUNCT TOMO COMPARISON:  Previous exam(s). ACR Breast Density Category b: There are scattered areas of fibroglandular density. FINDINGS: There are no findings suspicious for malignancy. Images were processed with CAD. IMPRESSION: No mammographic evidence of malignancy. A result letter of this screening mammogram will be mailed directly to the patient. RECOMMENDATION: Screening mammogram in one year. (Code:SM-B-01Y) BI-RADS CATEGORY  1: Negative. Electronically Signed   By: Britta Mccreedy M.D.   On: 12/21/2015 07:18   MS DIGITAL SCREENING TOMO BILATERAL  Result Date: 06/11/2019 CLINICAL DATA:  Screening. EXAM: DIGITAL SCREENING BILATERAL MAMMOGRAM WITH TOMO AND CAD COMPARISON:  Previous exam(s). ACR Breast Density Category c: The breast tissue is heterogeneously dense, which may obscure small masses. FINDINGS: There are no findings suspicious for malignancy. Images were processed with CAD. IMPRESSION: No mammographic evidence of malignancy. A result letter of this screening mammogram will be mailed directly to the patient. RECOMMENDATION: Screening mammogram in one year. (Code:SM-B-01Y) BI-RADS CATEGORY  1: Negative. Electronically Signed   By: Sande Brothers  M.D.   On: 06/11/2019 12:42    Pelvic/Bimanual Pap is not indicated today per BCCCP guidelines.   Smoking History: Patient has never smoked.    Patient Navigation: Patient education provided. Access to services provided for patient through [facility] [place] program. Spanish interpreter Alene Mires from [facility] provided.   Colorectal Cancer Screening: Per patient has never had colonoscopy completed. No complaints today.    Breast and Cervical Cancer Risk Assessment: Patient does not have family history of breast cancer, known genetic mutations, or radiation treatment to the chest before age 40. Patient does not have history of cervical dysplasia, immunocompromised, or DES exposure in-utero.  Risk Assessment     Risk Scores       07/01/2020   Last edited by: Meryl Dare, CMA   5-year risk: 0.4 %   Lifetime risk: 4.9 %            A: BCCCP exam without pap smear No complaints.  P: Referred patient to the [facility] for a screening mammogram on the mobile unit. Appointment scheduled Thursday, July 01, 2020 at 1120.  Priscille Heidelberg, RN 07/01/2020 10:30 AM

## 2021-07-27 ENCOUNTER — Encounter: Payer: Self-pay | Admitting: Nurse Practitioner

## 2021-07-27 ENCOUNTER — Ambulatory Visit (INDEPENDENT_AMBULATORY_CARE_PROVIDER_SITE_OTHER): Payer: BC Managed Care – PPO | Admitting: Nurse Practitioner

## 2021-07-27 VITALS — BP 112/74 | HR 63 | Resp 20 | Ht 61.42 in | Wt 193.8 lb

## 2021-07-27 DIAGNOSIS — Z01419 Encounter for gynecological examination (general) (routine) without abnormal findings: Secondary | ICD-10-CM | POA: Diagnosis not present

## 2021-07-27 DIAGNOSIS — E063 Autoimmune thyroiditis: Secondary | ICD-10-CM | POA: Diagnosis not present

## 2021-07-27 DIAGNOSIS — L659 Nonscarring hair loss, unspecified: Secondary | ICD-10-CM

## 2021-07-27 DIAGNOSIS — Z1211 Encounter for screening for malignant neoplasm of colon: Secondary | ICD-10-CM

## 2021-07-27 DIAGNOSIS — E559 Vitamin D deficiency, unspecified: Secondary | ICD-10-CM | POA: Diagnosis not present

## 2021-07-27 DIAGNOSIS — E785 Hyperlipidemia, unspecified: Secondary | ICD-10-CM | POA: Diagnosis not present

## 2021-07-27 NOTE — Progress Notes (Signed)
Sheryl Logan [birth year] [id number]  .ann  History:  46 y.o. G6P4 presents today for annual exam. Cycles have become somewhat irregular. LMP 06/16/2021. Denies menopausal symptoms. Complains of hair loss and difficulty losing weight. She has a lot of work stress.  Normal pap history. H/O abnormal thyroid levels in the past, nonalcoholic fatty liver disease.   Menstrual history Period Duration (Days): 4 Period Pattern: Regular Menstrual Flow: Moderate Menstrual Control: Maxi pad Menstrual Control Change Freq (Hours): 6 Dysmenorrhea: None   Contraception/Family planning: none Sexually active: Yes  Health Maintenance Last Pap: 04/28/2019. Results were: Normal 5-year repeat Last mammogram: 07/01/2020. Results were: Normal Last colonoscopy: Never Last Dexa: Not indicated  Past medical history, past surgical history, family history and social history were all reviewed and documented in the EPIC chart. Married. 4 children ages 34, 20, 71, and 43.   ROS:  A ROS was performed and pertinent positives and negatives are included.  Exam:  Vitals:   07/27/21 1554  BP: 112/74  Pulse: 63  Resp: 20  SpO2: 98%  Weight: 193 lb 12.8 oz (87.9 kg)  Height: 5' 1.42" (1.56 m)    Body mass index is 36.12 kg/m.   General appearance:  Normal Thyroid:  Symmetrical, normal in size, without palpable masses or nodularity. Respiratory  Auscultation:  Clear without wheezing or rhonchi Cardiovascular  Auscultation:  Regular rate, without rubs, murmurs or gallops  Edema/varicosities:  Not grossly evident Abdominal  Soft,nontender, without masses, guarding or rebound.  Liver/spleen:  No organomegaly noted  Hernia:  None appreciated  Skin  Inspection:  Grossly normal Breasts: Examined lying and sitting.   Right: Without masses, retractions, nipple discharge or axillary adenopathy.   Left: Without masses, retractions, nipple discharge or axillary adenopathy. Genitourinary   Inguinal/mons:  Normal  without inguinal adenopathy  External genitalia:  Normal appearing vulva with no masses, tenderness, or lesions  BUS/Urethra/Skene's glands:  Normal  Vagina:  Normal appearing with normal color and discharge, no lesions  Cervix:  Normal appearing without discharge or lesions  Uterus:  Normal in size, shape and contour.  Midline and mobile, nontender  Adnexa/parametria:     Rt: Normal in size, without masses or tenderness.   Lt: Normal in size, without masses or tenderness.  Anus and perineum: Normal  Digital rectal exam: Normal sphincter tone without palpated masses or tenderness  Patient informed chaperone available to be present for breast and pelvic exam. Patient has requested no chaperone to be present. Patient has been advised what will be completed during breast and pelvic exam.    Assessment/Plan:  46 y.o.  G6P4 for annual exam.    Well female exam with routine gynecological exam - Plan: CBC with Differential/Platelet, Comprehensive metabolic panel. Education provided on SBEs, importance of preventative screenings, current guidelines, high calcium diet, regular exercise, and multivitamin daily.   Acquired autoimmune hypothyroidism - Plan: TSH. H/O abnormal thyroid levels in the past.   Hair loss - Plan: Vitamin B12, VITAMIN D 25 Hydroxy (Vit-D Deficiency, Fractures)  Screening for colon cancer - Discussed current guidelines and importance of preventative screenings. Would like referral for colonoscopy.   Screening for cervical cancer - Normal Pap history.  Will repeat at 5-year interval per guidelines.  Screening for breast cancer - Normal mammogram history.  Continue annual screenings.  Normal breast exam today.  Return in 1 year for annual exam.     Olivia Mackie [facility], 4:09 PM 07/27/2021

## 2021-07-28 ENCOUNTER — Telehealth: Payer: Self-pay

## 2021-07-28 ENCOUNTER — Other Ambulatory Visit: Payer: Self-pay | Admitting: Nurse Practitioner

## 2021-07-28 DIAGNOSIS — Z1211 Encounter for screening for malignant neoplasm of colon: Secondary | ICD-10-CM

## 2021-07-28 DIAGNOSIS — E559 Vitamin D deficiency, unspecified: Secondary | ICD-10-CM

## 2021-07-28 LAB — CBC WITH DIFFERENTIAL/PLATELET
Absolute Monocytes: 378 cells/uL (ref 200–950)
Basophils Absolute: 37 cells/uL (ref 0–200)
Basophils Relative: 0.6 %
Eosinophils Absolute: 118 cells/uL (ref 15–500)
Eosinophils Relative: 1.9 %
HCT: 40.2 % (ref 35.0–45.0)
Hemoglobin: 13.3 g/dL (ref 11.7–15.5)
Lymphs Abs: 2294 cells/uL (ref 850–3900)
MCH: 30.2 pg (ref 27.0–33.0)
MCHC: 33.1 g/dL (ref 32.0–36.0)
MCV: 91.2 fL (ref 80.0–100.0)
MPV: 9.8 fL (ref 7.5–12.5)
Monocytes Relative: 6.1 %
Neutro Abs: 3373 cells/uL (ref 1500–7800)
Neutrophils Relative %: 54.4 %
Platelets: 326 10*3/uL (ref 140–400)
RBC: 4.41 10*6/uL (ref 3.80–5.10)
RDW: 12.7 % (ref 11.0–15.0)
Total Lymphocyte: 37 %
WBC: 6.2 10*3/uL (ref 3.8–10.8)

## 2021-07-28 LAB — VITAMIN D 25 HYDROXY (VIT D DEFICIENCY, FRACTURES): Vit D, 25-Hydroxy: 14 ng/mL — ABNORMAL LOW (ref 30–100)

## 2021-07-28 LAB — COMPREHENSIVE METABOLIC PANEL
AG Ratio: 1.1 (calc) (ref 1.0–2.5)
ALT: 16 U/L (ref 6–29)
AST: 17 U/L (ref 10–35)
Albumin: 3.9 g/dL (ref 3.6–5.1)
Alkaline phosphatase (APISO): 67 U/L (ref 31–125)
BUN: 10 mg/dL (ref 7–25)
CO2: 24 mmol/L (ref 20–32)
Calcium: 8.9 mg/dL (ref 8.6–10.2)
Chloride: 106 mmol/L (ref 98–110)
Creat: 0.64 mg/dL (ref 0.50–0.99)
Globulin: 3.4 g/dL (calc) (ref 1.9–3.7)
Glucose, Bld: 94 mg/dL (ref 65–99)
Potassium: 3.8 mmol/L (ref 3.5–5.3)
Sodium: 139 mmol/L (ref 135–146)
Total Bilirubin: 0.4 mg/dL (ref 0.2–1.2)
Total Protein: 7.3 g/dL (ref 6.1–8.1)

## 2021-07-28 LAB — TSH: TSH: 2.34 mIU/L

## 2021-07-28 LAB — VITAMIN B12: Vitamin B-12: 291 pg/mL (ref 200–1100)

## 2021-07-28 MED ORDER — VITAMIN D (ERGOCALCIFEROL) 1.25 MG (50000 UNIT) PO CAPS: 50000.0000 [IU] | ORAL_CAPSULE | ORAL | 0 refills | Status: AC

## 2021-07-28 NOTE — Telephone Encounter (Signed)
Olivia Mackie, NP  [facility] Triage Please send referral for screening colonoscopy. Spanish speaking.

## 2021-10-30 IMAGING — MG DIGITAL SCREENING BILAT W/ TOMO W/ CAD
8 series · 8 of 24 positions shown · non-contrast
Comparison: Previous exam(s).

CLINICAL DATA: Screening.

EXAM:
DIGITAL SCREENING BILATERAL MAMMOGRAM WITH TOMO AND CAD

[L MLO synth-2D]
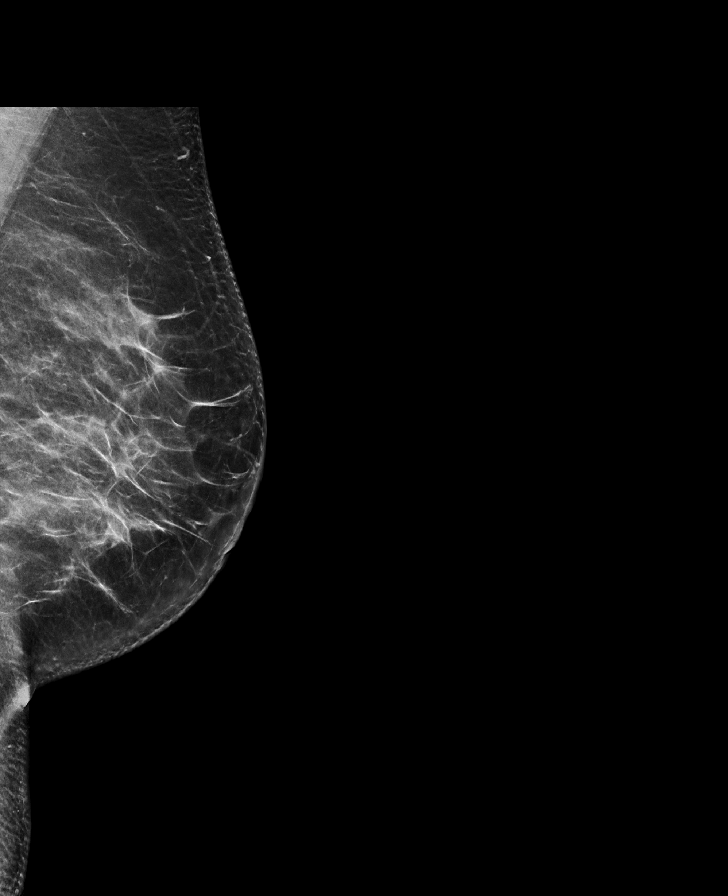

[R MLO synth-2D]
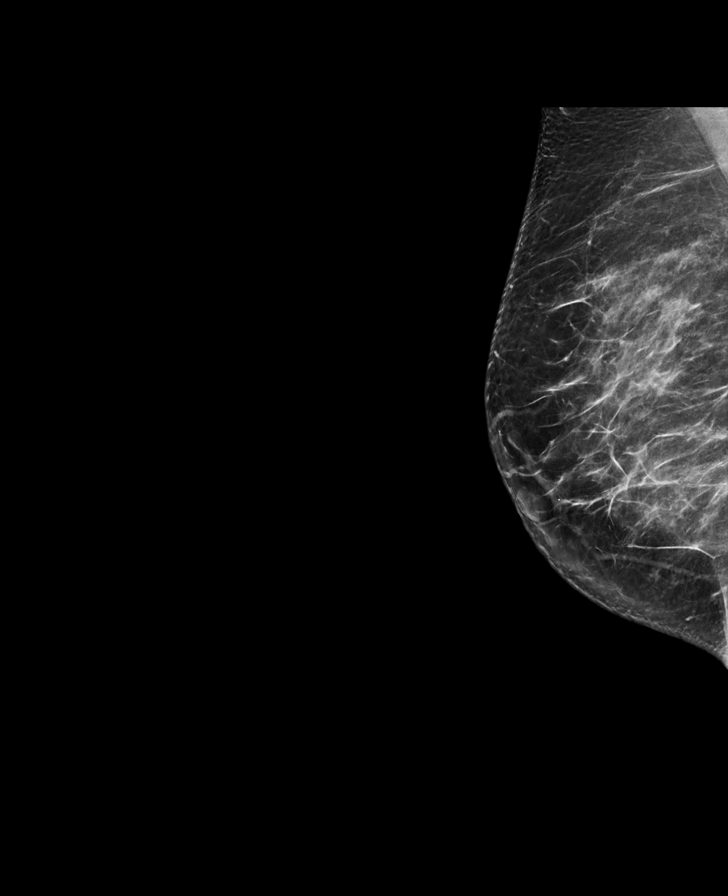

[L CC synth-2D]
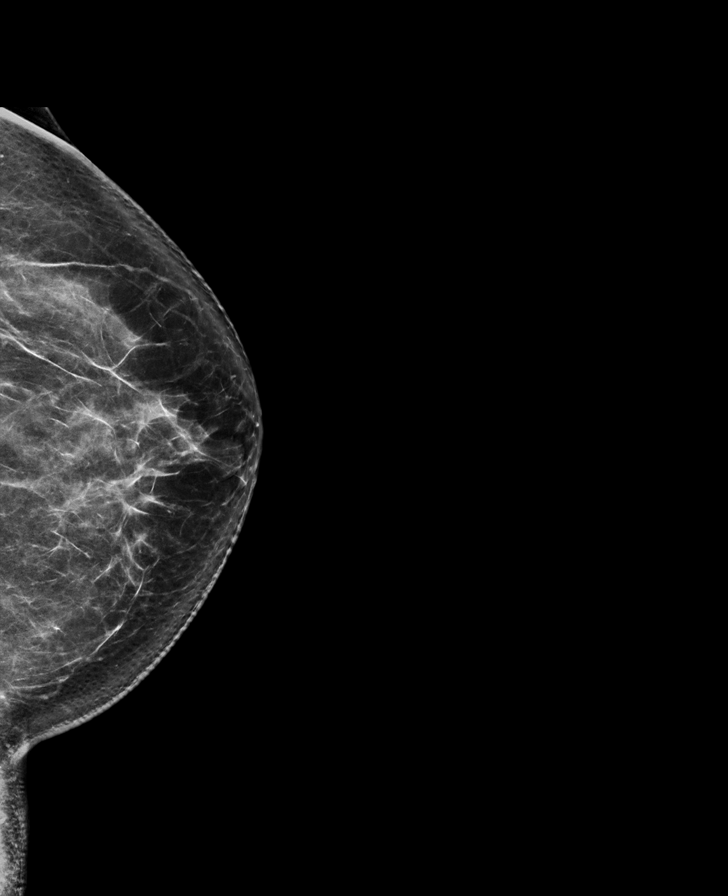

[R CC synth-2D]
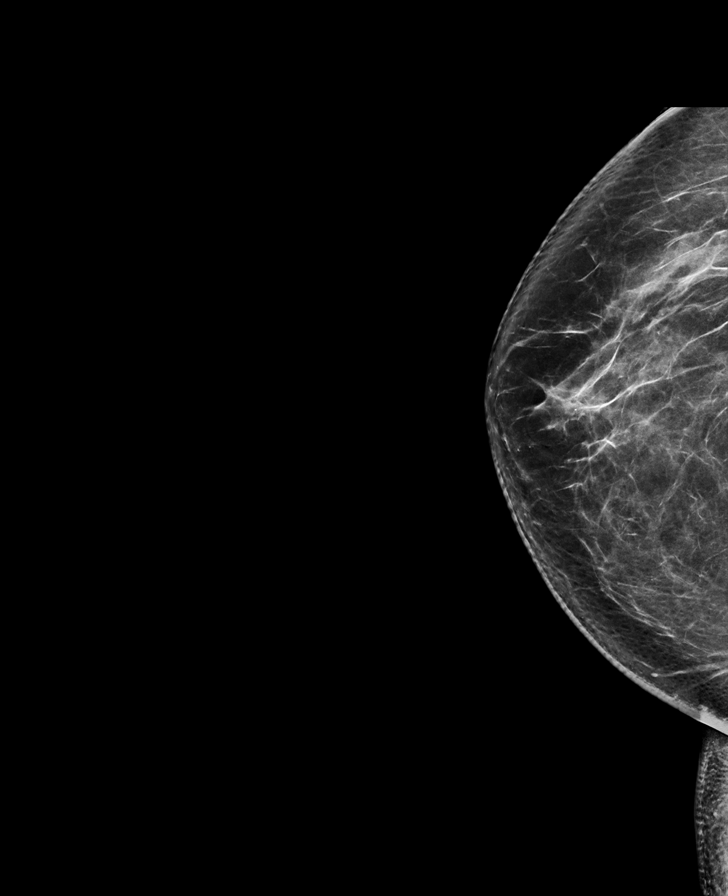

[R CC tomo · tomo slice 41/81.0]
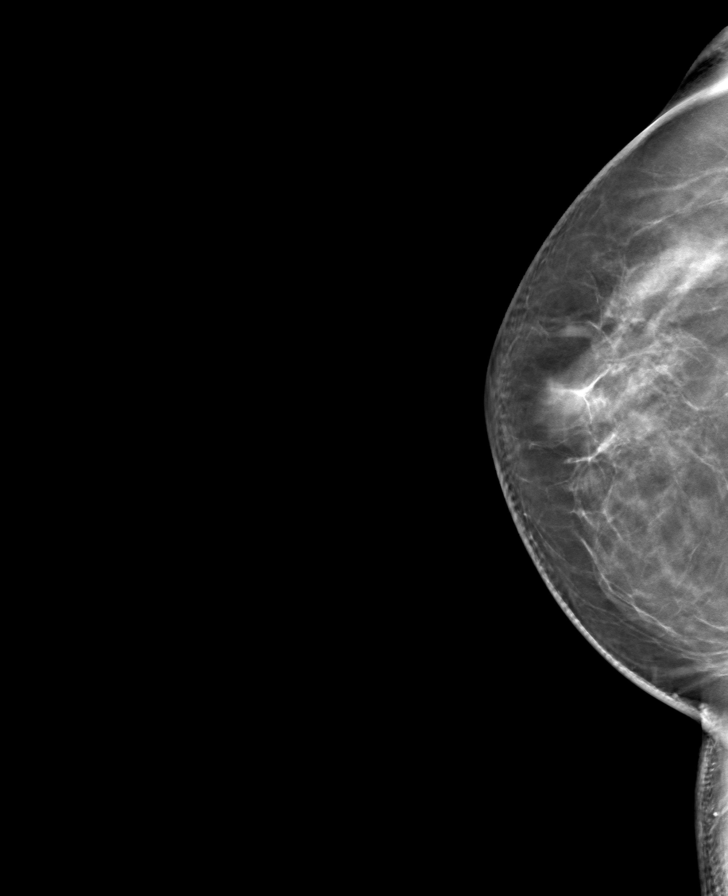

[R MLO tomo · tomo slice 39/78.0]
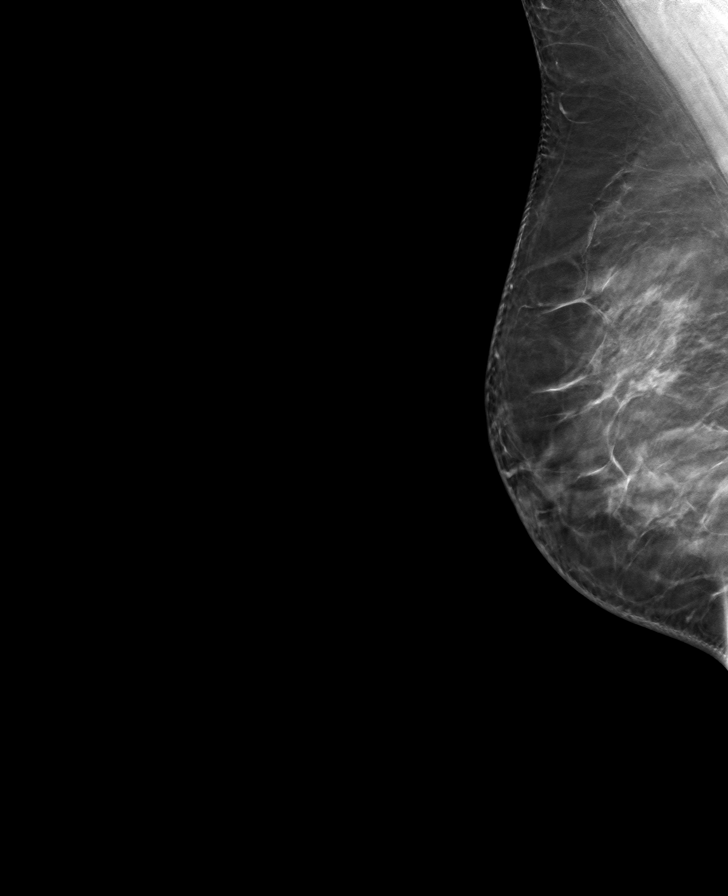

[L CC tomo · tomo slice 40/79.0]
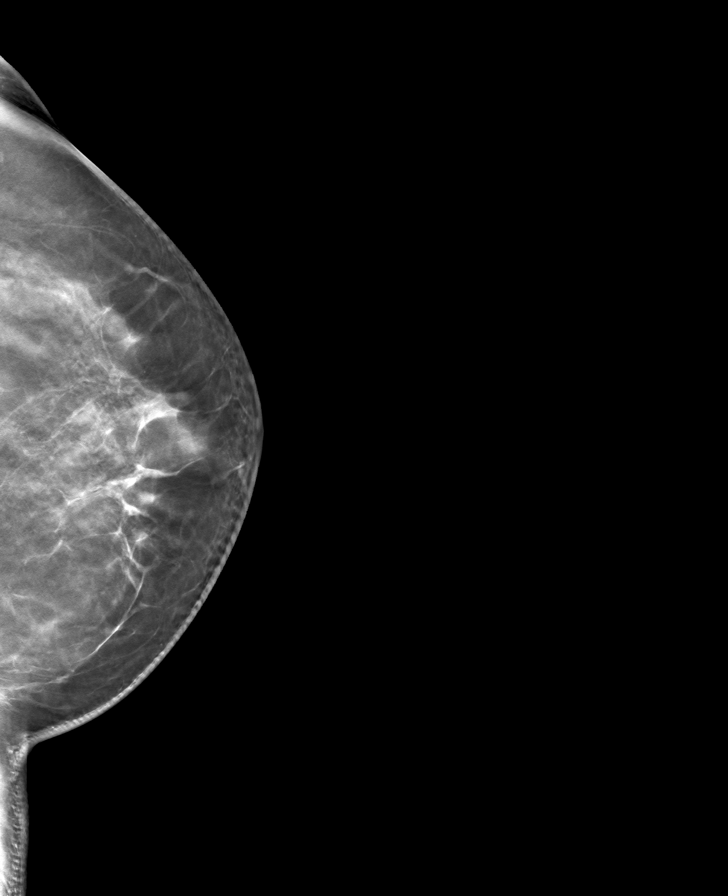

[L MLO tomo · tomo slice 41/81.0]
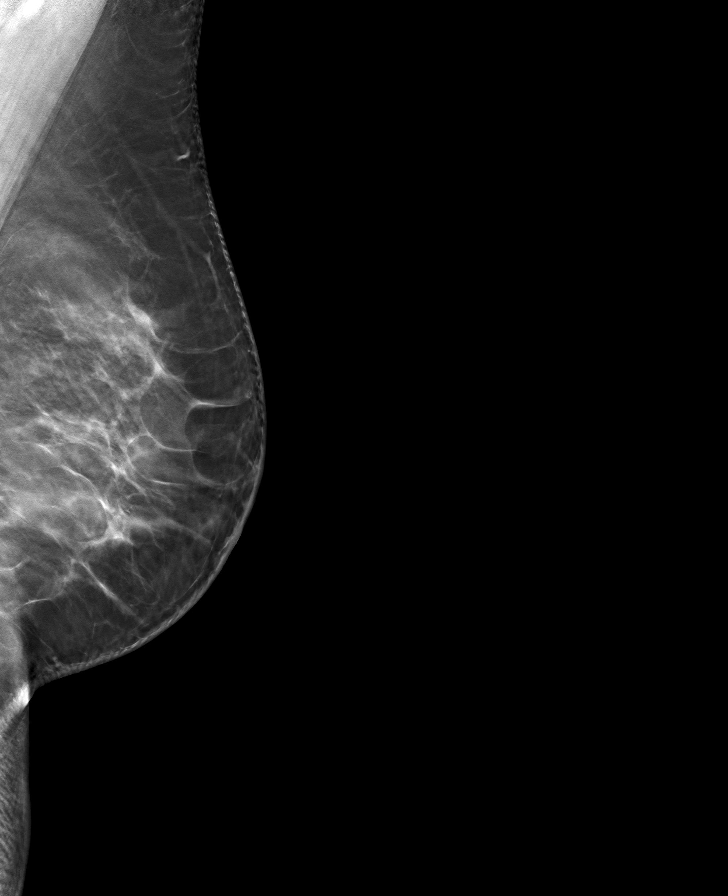

[8 of 24 positions shown; findings below may reference images not displayed]

ACR Breast Density Category c: The breast tissue is heterogeneously
dense, which may obscure small masses.
FINDINGS: There are no findings suspicious for malignancy. Images were
processed with CAD.
IMPRESSION: No mammographic evidence of malignancy. A result letter of this
screening mammogram will be mailed directly to the patient.

RECOMMENDATION:
Screening mammogram in one year. (Code:[id number]-[id number])

BI-RADS CATEGORY  1: Negative.

## 2022-10-16 DIAGNOSIS — H0014 Chalazion left upper eyelid: Secondary | ICD-10-CM | POA: Diagnosis not present

## 2022-10-24 ENCOUNTER — Other Ambulatory Visit: Payer: Self-pay | Admitting: Nurse Practitioner

## 2022-10-24 ENCOUNTER — Ambulatory Visit (INDEPENDENT_AMBULATORY_CARE_PROVIDER_SITE_OTHER): Payer: BC Managed Care – PPO | Admitting: Nurse Practitioner

## 2022-10-24 ENCOUNTER — Encounter: Payer: Self-pay | Admitting: Nurse Practitioner

## 2022-10-24 VITALS — BP 134/80 | HR 77 | Ht 60.24 in | Wt 201.4 lb

## 2022-10-24 DIAGNOSIS — E063 Autoimmune thyroiditis: Secondary | ICD-10-CM | POA: Diagnosis not present

## 2022-10-24 DIAGNOSIS — N951 Menopausal and female climacteric states: Secondary | ICD-10-CM | POA: Diagnosis not present

## 2022-10-24 DIAGNOSIS — E559 Vitamin D deficiency, unspecified: Secondary | ICD-10-CM

## 2022-10-24 DIAGNOSIS — E7800 Pure hypercholesterolemia, unspecified: Secondary | ICD-10-CM

## 2022-10-24 DIAGNOSIS — Z01419 Encounter for gynecological examination (general) (routine) without abnormal findings: Secondary | ICD-10-CM | POA: Diagnosis not present

## 2022-10-24 DIAGNOSIS — Z6839 Body mass index (BMI) 39.0-39.9, adult: Secondary | ICD-10-CM | POA: Diagnosis not present

## 2022-10-24 DIAGNOSIS — Z0000 Encounter for general adult medical examination without abnormal findings: Secondary | ICD-10-CM

## 2022-10-24 NOTE — Progress Notes (Signed)
Sheryl Logan 08-23-1975 [id number]   History:  47 y.o. [id number] presents today for annual exam. Cycles occurring every 3-4 months. Denies menopausal symptoms. Normal pap history. H/O abnormal thyroid levels in the past, nonalcoholic fatty liver disease. Has questions about losing weight. Interpreter present during visit.   Gynecologic History Patient's last menstrual period was 10/15/2022 (exact date). Period Cycle (Days):  (Periods are not every month now) Period Duration (Days): 2-3 Period Pattern: (!) Irregular Menstrual Flow: Light Menstrual Control: Maxi pad Contraception/Family planning: none Sexually active: Yes  Health Maintenance Last Pap: 04/28/2019. Results were: Normal neg HPV, 5-year repeat Last mammogram: 07/01/2020. Results were: Normal Last colonoscopy: Never Last Dexa: Not indicated  Past medical history, past surgical history, family history and social history were all reviewed and documented in the EPIC chart. Married. Works at [employer]. 4 children ages 8, 48, 6, and 80.   ROS:  A ROS was performed and pertinent positives and negatives are included.  Exam:  Vitals:   10/24/22 1451  BP: 134/80  Pulse: 77  Weight: 201 lb 6.4 oz (91.4 kg)  Height: 5' 0.24" (1.53 m)   Body mass index is 39.03 kg/m. Physical Exam  General appearance:  Normal Thyroid:  Symmetrical, normal in size, without palpable masses or nodularity. Respiratory  Auscultation:  Clear without wheezing or rhonchi Cardiovascular  Auscultation:  Regular rate, without rubs, murmurs or gallops  Edema/varicosities:  Not grossly evident Abdominal  Soft,nontender, without masses, guarding or rebound.  Liver/spleen:  No organomegaly noted  Hernia:  None appreciated  Skin  Inspection:  Grossly normal Breasts: Examined lying and sitting.   Right: Without masses, retractions, nipple discharge or axillary adenopathy.   Left: Without masses, retractions, nipple discharge or axillary  adenopathy. Pelvic: External genitalia:  no lesions              Urethra:  normal appearing urethra with no masses, tenderness or lesions              Bartholins and Skenes: normal                 Vagina: normal appearing vagina with normal color and discharge, no lesions              Cervix: no lesions Bimanual Exam:  Uterus:  no masses or tenderness              Adnexa: no mass, fullness, tenderness              Rectovaginal: Deferred              Anus:  normal, no lesions   Patient informed chaperone available to be present for breast and pelvic exam. Patient has requested no chaperone to be present. Patient has been advised what will be completed during breast and pelvic exam.   Assessment/Plan:  47 y.o. [id number] for annual exam.   Well female exam with routine gynecological exam - Plan: CBC with Differential/Platelet, Comprehensive metabolic panel. Education provided on SBEs, importance of preventative screenings, current guidelines, high calcium diet, regular exercise, and multivitamin daily.   Acquired autoimmune hypothyroidism - Plan: TSH. H/O abnormal thyroid levels in the past.   Vitamin D deficiency - Plan: VITAMIN D 25 Hydroxy (Vit-D Deficiency, Fractures)  Elevated LDL cholesterol level - Plan: Lipid panel  BMI 39.0-39.9,adult - Recommend seeing healthy weight and wellness or nutritionist. Does not exercise. Educated on importance of daily calorie deficit for weight loss.   Perimenopause - cycles ever 3-4  months. Denies menopausal symptoms.   Screening for colon cancer - Discussed current guidelines and importance of preventative screenings. Cologuard versus colonoscopy discussed. Declines at this time.   Screening for cervical cancer - Normal Pap history.  Will repeat at 5-year interval per guidelines.  Screening for breast cancer - Normal mammogram history. Overdue and encouraged to schedule soon.  Normal breast exam today.  Return in about 1 year (around 10/24/2023)  for Annual.   Olivia Mackie DNP, 3:05 PM 10/24/2022

## 2022-10-25 ENCOUNTER — Ambulatory Visit
Admission: RE | Admit: 2022-10-25 | Discharge: 2022-10-25 | Disposition: A | Discharge status: Home / Self Care | Payer: BC Managed Care – PPO | Source: Ambulatory Visit | Attending: Nurse Practitioner | Admitting: Nurse Practitioner

## 2022-10-25 ENCOUNTER — Other Ambulatory Visit: Payer: Self-pay | Admitting: Nurse Practitioner

## 2022-10-25 DIAGNOSIS — Z0000 Encounter for general adult medical examination without abnormal findings: Secondary | ICD-10-CM

## 2022-10-25 DIAGNOSIS — Z1231 Encounter for screening mammogram for malignant neoplasm of breast: Secondary | ICD-10-CM | POA: Diagnosis not present

## 2022-10-25 DIAGNOSIS — E559 Vitamin D deficiency, unspecified: Secondary | ICD-10-CM

## 2022-10-25 LAB — CBC WITH DIFFERENTIAL/PLATELET
Absolute Monocytes: 384 {cells}/uL (ref 200–950)
Basophils Absolute: 33 {cells}/uL (ref 0–200)
Basophils Relative: 0.5 %
Eosinophils Absolute: 143 {cells}/uL (ref 15–500)
Eosinophils Relative: 2.2 %
HCT: 44.1 % (ref 35.0–45.0)
Hemoglobin: 14.3 g/dL (ref 11.7–15.5)
Lymphs Abs: 2340 {cells}/uL (ref 850–3900)
MCH: 29.4 pg (ref 27.0–33.0)
MCHC: 32.4 g/dL (ref 32.0–36.0)
MCV: 90.6 fL (ref 80.0–100.0)
MPV: 9.6 fL (ref 7.5–12.5)
Monocytes Relative: 5.9 %
Neutro Abs: 3601 {cells}/uL (ref 1500–7800)
Neutrophils Relative %: 55.4 %
Platelets: 346 10*3/uL (ref 140–400)
RBC: 4.87 10*6/uL (ref 3.80–5.10)
RDW: 12.5 % (ref 11.0–15.0)
Total Lymphocyte: 36 %
WBC: 6.5 10*3/uL (ref 3.8–10.8)

## 2022-10-25 LAB — COMPREHENSIVE METABOLIC PANEL
AG Ratio: 1.1 (calc) (ref 1.0–2.5)
ALT: 56 U/L — ABNORMAL HIGH (ref 6–29)
AST: 59 U/L — ABNORMAL HIGH (ref 10–35)
Albumin: 4.1 g/dL (ref 3.6–5.1)
Alkaline phosphatase (APISO): 87 U/L (ref 31–125)
BUN: 14 mg/dL (ref 7–25)
CO2: 23 mmol/L (ref 20–32)
Calcium: 8.9 mg/dL (ref 8.6–10.2)
Chloride: 104 mmol/L (ref 98–110)
Creat: 0.74 mg/dL (ref 0.50–0.99)
Globulin: 3.8 g/dL — ABNORMAL HIGH (ref 1.9–3.7)
Glucose, Bld: 89 mg/dL (ref 65–99)
Potassium: 3.9 mmol/L (ref 3.5–5.3)
Sodium: 139 mmol/L (ref 135–146)
Total Bilirubin: 0.4 mg/dL (ref 0.2–1.2)
Total Protein: 7.9 g/dL (ref 6.1–8.1)

## 2022-10-25 LAB — TSH: TSH: 2.79 m[IU]/L

## 2022-10-25 LAB — VITAMIN D 25 HYDROXY (VIT D DEFICIENCY, FRACTURES): Vit D, 25-Hydroxy: 18 ng/mL — ABNORMAL LOW (ref 30–100)

## 2022-10-25 LAB — LIPID PANEL
Cholesterol: 194 mg/dL (ref <200)
HDL: 39 mg/dL — ABNORMAL LOW (ref >=50)
LDL Cholesterol (Calc): 127 mg/dL — ABNORMAL HIGH
Non-HDL Cholesterol (Calc): 155 mg/dL — ABNORMAL HIGH (ref <130)
Total CHOL/HDL Ratio: 5 (calc) — ABNORMAL HIGH (ref <5.0)
Triglycerides: 164 mg/dL — ABNORMAL HIGH (ref <150)

## 2022-10-25 MED ORDER — VITAMIN D (ERGOCALCIFEROL) 1.25 MG (50000 UNIT) PO CAPS
50000.0000 [IU] | ORAL_CAPSULE | ORAL | 0 refills | Status: AC
Start: 2022-10-25 — End: 2023-01-11

## 2022-11-29 NOTE — Progress Notes (Signed)
Yes, thank you.

## 2022-12-27 ENCOUNTER — Telehealth: Payer: Self-pay | Admitting: *Deleted

## 2022-12-27 NOTE — Telephone Encounter (Signed)
Patient left message requesting return call at [phone] for results

## 2022-12-27 NOTE — Telephone Encounter (Signed)
Call returned to patient using [facility] Interpreter [phone]. Spoke with patient, advised of results dated 10/24/22 per TW.  Patient read back instructions and confirmed. Patient is aware to call if any additional questions or assistance needed.   Routing to provider for final review. Patient is agreeable to disposition. Will close encounter.

## 2023-10-29 ENCOUNTER — Ambulatory Visit: Payer: BC Managed Care – PPO | Admitting: Nurse Practitioner

## 2023-10-29 ENCOUNTER — Other Ambulatory Visit (HOSPITAL_COMMUNITY)
Admission: RE | Admit: 2023-10-29 | Discharge: 2023-10-29 | Disposition: A | Discharge status: Home / Self Care | Source: Ambulatory Visit | Attending: Nurse Practitioner | Admitting: Nurse Practitioner

## 2023-10-29 ENCOUNTER — Encounter: Payer: Self-pay | Admitting: Nurse Practitioner

## 2023-10-29 VITALS — BP 118/78 | HR 76 | Ht 60.25 in | Wt 202.0 lb

## 2023-10-29 DIAGNOSIS — Z124 Encounter for screening for malignant neoplasm of cervix: Secondary | ICD-10-CM

## 2023-10-29 DIAGNOSIS — R5383 Other fatigue: Secondary | ICD-10-CM | POA: Diagnosis not present

## 2023-10-29 DIAGNOSIS — E559 Vitamin D deficiency, unspecified: Secondary | ICD-10-CM

## 2023-10-29 DIAGNOSIS — E063 Autoimmune thyroiditis: Secondary | ICD-10-CM

## 2023-10-29 DIAGNOSIS — E785 Hyperlipidemia, unspecified: Secondary | ICD-10-CM | POA: Diagnosis not present

## 2023-10-29 DIAGNOSIS — Z1331 Encounter for screening for depression: Secondary | ICD-10-CM

## 2023-10-29 DIAGNOSIS — Z01419 Encounter for gynecological examination (general) (routine) without abnormal findings: Secondary | ICD-10-CM | POA: Diagnosis not present

## 2023-10-29 DIAGNOSIS — N951 Menopausal and female climacteric states: Secondary | ICD-10-CM

## 2023-10-29 DIAGNOSIS — Z6839 Body mass index (BMI) 39.0-39.9, adult: Secondary | ICD-10-CM

## 2023-10-29 NOTE — Progress Notes (Signed)
 Sheryl Logan 06/02/1975 [id number]   History:  48 y.o. [id number] presents today for annual exam. Cycles occurring every 3-4 months. Denies menopausal symptoms. Normal pap history. H/O abnormal thyroid  levels in the past, nonalcoholic fatty liver disease. Has difficulty losing weight. Has been more conscientious of her diet. Has felt more fatigued lately and unable to find energy to exercise. Works 3rd shift and has 6 yo son at home, so sleep pattern isn't great. Interpreter present during visit.   Gynecologic History Patient's last menstrual period was 10/10/2023 (exact date). Period Duration (Days): 6 Period Pattern: (!) Irregular (cycle in april & september 2025) Menstrual Flow: Moderate Menstrual Control: Maxi pad Dysmenorrhea:  (some cramping) Contraception/Family planning: none Sexually active: Yes  Health Maintenance Last Pap: 04/28/2019. Results were: Normal neg HPV Last mammogram: 10/25/2022. Results were: Normal Last colonoscopy: Never Last Dexa: Not indicated     10/29/2023    3:32 PM  Depression screen PHQ 2/9  Decreased Interest 0  Down, Depressed, Hopeless 0  PHQ - 2 Score 0     Past medical history, past surgical history, family history and social history were all reviewed and documented in the EPIC chart. Married. Works at [employer]. 4 children ages 26, 53, 68, and 48. 16 yo grandchild.   ROS:  A ROS was performed and pertinent positives and negatives are included.  Exam:  Vitals:   10/29/23 1525  BP: 118/78  Pulse: 76  SpO2: 99%  Weight: 202 lb (91.6 kg)  Height: 5' 0.25 (1.53 m)    Body mass index is 39.12 kg/m.   General appearance:  Normal Thyroid :  Symmetrical, normal in size, without palpable masses or nodularity. Respiratory  Auscultation:  Clear without wheezing or rhonchi Cardiovascular  Auscultation:  Regular rate, without rubs, murmurs or gallops  Edema/varicosities:  Not grossly evident Abdominal  Soft,nontender, without  masses, guarding or rebound.  Liver/spleen:  No organomegaly noted  Hernia:  None appreciated  Skin  Inspection:  Grossly normal Breasts: Examined lying and sitting.   Right: Without masses, retractions, nipple discharge or axillary adenopathy.   Left: Without masses, retractions, nipple discharge or axillary adenopathy. Pelvic: External genitalia:  no lesions              Urethra:  normal appearing urethra with no masses, tenderness or lesions              Bartholins and Skenes: normal                 Vagina: normal appearing vagina with normal color and discharge, no lesions              Cervix: no lesions Bimanual Exam:  Uterus:  no masses or tenderness              Adnexa: no mass, fullness, tenderness              Rectovaginal: Deferred              Anus:  normal, no lesions  Sheryl Logan, CMA present as chaperone.   Assessment/Plan:  48 y.o. [id number] for annual exam.   Well female exam with routine gynecological exam - Plan: Cytology - PAP( [place]), CBC with Differential/Platelet, Comprehensive metabolic panel with GFR. Education provided on SBEs, importance of preventative screenings, current guidelines, high calcium diet, regular exercise, and multivitamin daily.   Acquired autoimmune hypothyroidism - Plan: TSH. H/O abnormal thyroid  levels in the past.   Vitamin D  deficiency - Plan: VITAMIN  D 25 Hydroxy (Vit-D Deficiency, Fractures)  Hyperlipidemia, unspecified hyperlipidemia type - Plan: Lipid panel  BMI 39.0-39.9,adult - Plan: Lipid panel, Hemoglobin A1c. Discussed importance of daily calorie deficit for weight loss, increase exercise. Requesting medication for weight loss. Will call insurance.  Fatigue, unspecified type - Plan: CBC with Differential/Platelet, Comprehensive metabolic panel with GFR, VITAMIN D  25 Hydroxy (Vit-D Deficiency, Fractures), TSH  Perimenopause - cycles ever 3-4 months. Denies menopausal symptoms. Discussed what to expect during perimenopause.    Screening for colon cancer - Discussed current guidelines and importance of preventative screenings. Cologuard versus colonoscopy discussed. Declines at this time.   Cervical cancer screening - Plan: Cytology - PAP( [place]). Normal Pap history. Pap today per guidelines.   Screening for breast cancer - Normal mammogram history. Continue annual screenings. Normal breast exam today.  Return in about 1 year (around 10/28/2024) for Annual.   Sheryl DELENA Shutter DNP, 3:56 PM 10/29/2023

## 2023-10-30 ENCOUNTER — Ambulatory Visit: Payer: Self-pay | Admitting: Nurse Practitioner

## 2023-10-30 LAB — COMPREHENSIVE METABOLIC PANEL WITH GFR
AG Ratio: 1.1 (calc) (ref 1.0–2.5)
ALT: 45 U/L — ABNORMAL HIGH (ref 6–29)
AST: 52 U/L — ABNORMAL HIGH (ref 10–35)
Albumin: 4.1 g/dL (ref 3.6–5.1)
Alkaline phosphatase (APISO): 75 U/L (ref 31–125)
BUN: 10 mg/dL (ref 7–25)
CO2: 27 mmol/L (ref 20–32)
Calcium: 8.9 mg/dL (ref 8.6–10.2)
Chloride: 105 mmol/L (ref 98–110)
Creat: 0.76 mg/dL (ref 0.50–0.99)
Globulin: 3.9 g/dL — ABNORMAL HIGH (ref 1.9–3.7)
Glucose, Bld: 86 mg/dL (ref 65–99)
Potassium: 3.6 mmol/L (ref 3.5–5.3)
Sodium: 139 mmol/L (ref 135–146)
Total Bilirubin: 0.4 mg/dL (ref 0.2–1.2)
Total Protein: 8 g/dL (ref 6.1–8.1)
eGFR: 96 mL/min/1.73m2 (ref >=60)

## 2023-10-30 LAB — CBC WITH DIFFERENTIAL/PLATELET
Absolute Lymphocytes: 3199 {cells}/uL (ref 850–3900)
Absolute Monocytes: 567 {cells}/uL (ref 200–950)
Basophils Absolute: 63 {cells}/uL (ref 0–200)
Basophils Relative: 0.9 %
Eosinophils Absolute: 217 {cells}/uL (ref 15–500)
Eosinophils Relative: 3.1 %
HCT: 42 % (ref 35.0–45.0)
Hemoglobin: 13.9 g/dL (ref 11.7–15.5)
MCH: 30.5 pg (ref 27.0–33.0)
MCHC: 33.1 g/dL (ref 32.0–36.0)
MCV: 92.1 fL (ref 80.0–100.0)
MPV: 10.1 fL (ref 7.5–12.5)
Monocytes Relative: 8.1 %
Neutro Abs: 2954 {cells}/uL (ref 1500–7800)
Neutrophils Relative %: 42.2 %
Platelets: 346 Thousand/uL (ref 140–400)
RBC: 4.56 Million/uL (ref 3.80–5.10)
RDW: 12.5 % (ref 11.0–15.0)
Total Lymphocyte: 45.7 %
WBC: 7 Thousand/uL (ref 3.8–10.8)

## 2023-10-30 LAB — LIPID PANEL
Cholesterol: 193 mg/dL (ref <200)
HDL: 36 mg/dL — ABNORMAL LOW (ref >=50)
LDL Cholesterol (Calc): 125 mg/dL — ABNORMAL HIGH
Non-HDL Cholesterol (Calc): 157 mg/dL — ABNORMAL HIGH (ref <130)
Total CHOL/HDL Ratio: 5.4 (calc) — ABNORMAL HIGH (ref <5.0)
Triglycerides: 202 mg/dL — ABNORMAL HIGH (ref <150)

## 2023-10-30 LAB — HEMOGLOBIN A1C
Hgb A1c MFr Bld: 5.6 % (ref <5.7)
Mean Plasma Glucose: 114 mg/dL
eAG (mmol/L): 6.3 mmol/L

## 2023-10-30 LAB — TSH: TSH: 3.17 m[IU]/L

## 2023-10-30 LAB — VITAMIN D 25 HYDROXY (VIT D DEFICIENCY, FRACTURES): Vit D, 25-Hydroxy: 27 ng/mL — ABNORMAL LOW (ref 30–100)

## 2023-10-31 ENCOUNTER — Other Ambulatory Visit: Payer: Self-pay | Admitting: Nurse Practitioner

## 2023-10-31 DIAGNOSIS — E559 Vitamin D deficiency, unspecified: Secondary | ICD-10-CM

## 2023-10-31 LAB — CYTOLOGY - PAP
Adequacy: ABSENT
Comment: NEGATIVE
Diagnosis: NEGATIVE
High risk HPV: NEGATIVE

## 2023-10-31 MED ORDER — VITAMIN D (ERGOCALCIFEROL) 1.25 MG (50000 UNIT) PO CAPS: 50000.0000 [IU] | ORAL_CAPSULE | ORAL | 0 refills | Status: AC

## 2023-11-01 NOTE — Progress Notes (Signed)
 Call placed to patient using [facility] Interpreter ID# [phone], no answer, no voicemail option.

## 2023-11-05 ENCOUNTER — Encounter: Payer: Self-pay | Admitting: Nurse Practitioner

## 2023-12-19 ENCOUNTER — Encounter: Payer: Self-pay | Admitting: Nurse Practitioner

## 2023-12-19 DIAGNOSIS — Z1231 Encounter for screening mammogram for malignant neoplasm of breast: Secondary | ICD-10-CM

## 2023-12-20 ENCOUNTER — Other Ambulatory Visit: Payer: Self-pay | Admitting: Nurse Practitioner

## 2023-12-20 DIAGNOSIS — Z1231 Encounter for screening mammogram for malignant neoplasm of breast: Secondary | ICD-10-CM

## 2023-12-21 ENCOUNTER — Ambulatory Visit

## 2023-12-21 DIAGNOSIS — Z1231 Encounter for screening mammogram for malignant neoplasm of breast: Secondary | ICD-10-CM
